# Patient Record
Sex: Male | Born: 1970
Health system: Southern US, Community
[De-identification: ages and names within clinical notes are randomized; demographics above are authoritative.]

## PROBLEM LIST (undated history)

## (undated) DIAGNOSIS — E785 Hyperlipidemia, unspecified: Secondary | ICD-10-CM

## (undated) HISTORY — DX: Hyperlipidemia, unspecified: E78.5

---

## 2007-02-06 ENCOUNTER — Ambulatory Visit: Payer: Self-pay | Admitting: Internal Medicine

## 2007-02-09 ENCOUNTER — Ambulatory Visit: Payer: Self-pay | Admitting: *Deleted

## 2007-02-20 ENCOUNTER — Ambulatory Visit: Payer: Self-pay | Admitting: Family Medicine

## 2007-02-20 ENCOUNTER — Encounter (INDEPENDENT_AMBULATORY_CARE_PROVIDER_SITE_OTHER): Payer: Self-pay | Admitting: Internal Medicine

## 2007-02-20 LAB — CONVERTED CEMR LAB
HDL: 38 mg/dL — ABNORMAL LOW (ref >39)
Total CHOL/HDL Ratio: 5.7
Triglycerides: 407 mg/dL — ABNORMAL HIGH (ref <150)

## 2007-04-04 ENCOUNTER — Ambulatory Visit: Payer: Self-pay | Admitting: Family Medicine

## 2007-04-26 ENCOUNTER — Ambulatory Visit (HOSPITAL_COMMUNITY): Admission: RE | Admit: 2007-04-26 | Discharge: 2007-04-26 | Payer: Self-pay | Admitting: Internal Medicine

## 2007-04-26 ENCOUNTER — Ambulatory Visit: Payer: Self-pay | Admitting: Internal Medicine

## 2007-04-26 LAB — CONVERTED CEMR LAB
ALT: 18 units/L (ref 0–53)
AST: 21 units/L (ref 0–37)
Albumin: 4.9 g/dL (ref 3.5–5.2)
Alkaline Phosphatase: 75 units/L (ref 39–117)
Basophils Absolute: 0 10*3/uL (ref 0.0–0.1)
Eosinophils Absolute: 0.1 10*3/uL (ref 0.0–0.7)
Eosinophils Relative: 2 % (ref 0–5)
Glucose, Bld: 115 mg/dL — ABNORMAL HIGH (ref 70–99)
Lymphs Abs: 2.8 10*3/uL (ref 0.7–4.0)
MCV: 88.1 fL (ref 78.0–100.0)
Neutrophils Relative %: 51 % (ref 43–77)
Platelets: 214 10*3/uL (ref 150–400)
Potassium: 4.2 meq/L (ref 3.5–5.3)
RDW: 13.3 % (ref 11.5–15.5)
Sodium: 142 meq/L (ref 135–145)
TSH: 4.779 microintl units/mL (ref 0.350–5.50)
Total Bilirubin: 0.9 mg/dL (ref 0.3–1.2)
Total Protein: 7.1 g/dL (ref 6.0–8.3)
Triglycerides: 424 mg/dL — ABNORMAL HIGH (ref <150)
WBC: 7.1 10*3/uL (ref 4.0–10.5)

## 2007-05-02 ENCOUNTER — Ambulatory Visit: Payer: Self-pay | Admitting: Internal Medicine

## 2007-08-08 ENCOUNTER — Encounter: Payer: Self-pay | Admitting: Family Medicine

## 2007-08-08 ENCOUNTER — Ambulatory Visit: Payer: Self-pay | Admitting: Internal Medicine

## 2007-08-08 LAB — CONVERTED CEMR LAB
ALT: 26 units/L (ref 0–53)
AST: 27 units/L (ref 0–37)
Albumin: 4.7 g/dL (ref 3.5–5.2)
Alkaline Phosphatase: 70 units/L (ref 39–117)
BUN: 11 mg/dL (ref 6–23)
Calcium: 9.8 mg/dL (ref 8.4–10.5)
Chloride: 103 meq/L (ref 96–112)
HDL: 43 mg/dL (ref >39)
Helicobacter Pylori Antibody-IgG: 1.8 — ABNORMAL HIGH
LDL Cholesterol: 88 mg/dL (ref 0–99)
Potassium: 4.6 meq/L (ref 3.5–5.3)
Sodium: 140 meq/L (ref 135–145)
TSH: 2.186 microintl units/mL (ref 0.350–4.50)

## 2007-12-24 ENCOUNTER — Ambulatory Visit: Payer: Self-pay | Admitting: Internal Medicine

## 2008-02-12 ENCOUNTER — Ambulatory Visit: Payer: Self-pay | Admitting: Internal Medicine

## 2008-02-12 ENCOUNTER — Encounter: Payer: Self-pay | Admitting: Family Medicine

## 2008-02-12 LAB — CONVERTED CEMR LAB
ALT: 12 units/L (ref 0–53)
AST: 18 units/L (ref 0–37)
Albumin: 4.9 g/dL (ref 3.5–5.2)
Alkaline Phosphatase: 75 units/L (ref 39–117)
BUN: 24 mg/dL — ABNORMAL HIGH (ref 6–23)
Chloride: 104 meq/L (ref 96–112)
Creatinine, Ser: 1.14 mg/dL (ref 0.40–1.50)
HDL: 48 mg/dL (ref >39)
PSA: 0.53 ng/mL (ref 0.10–4.00)
Potassium: 4.3 meq/L (ref 3.5–5.3)
Testosterone: 290.1 ng/dL — ABNORMAL LOW (ref 350–890)
Total CHOL/HDL Ratio: 4.9

## 2008-02-20 ENCOUNTER — Ambulatory Visit: Payer: Self-pay | Admitting: Internal Medicine

## 2008-05-07 ENCOUNTER — Ambulatory Visit: Payer: Self-pay | Admitting: Family Medicine

## 2008-05-07 LAB — CONVERTED CEMR LAB
ALT: 14 units/L (ref 0–53)
AST: 23 units/L (ref 0–37)
Alkaline Phosphatase: 81 units/L (ref 39–117)
BUN: 16 mg/dL (ref 6–23)
Creatinine, Ser: 1.1 mg/dL (ref 0.40–1.50)
HDL: 49 mg/dL (ref >39)
LDL Cholesterol: 105 mg/dL — ABNORMAL HIGH (ref 0–99)
Total CHOL/HDL Ratio: 3.8
VLDL: 30 mg/dL (ref 0–40)

## 2008-05-21 ENCOUNTER — Ambulatory Visit: Payer: Self-pay | Admitting: Family Medicine

## 2008-08-20 ENCOUNTER — Encounter (INDEPENDENT_AMBULATORY_CARE_PROVIDER_SITE_OTHER): Payer: Self-pay | Admitting: Gastroenterology

## 2008-08-20 ENCOUNTER — Ambulatory Visit (HOSPITAL_COMMUNITY): Admission: RE | Admit: 2008-08-20 | Discharge: 2008-08-20 | Payer: Self-pay | Admitting: Gastroenterology

## 2009-01-07 ENCOUNTER — Ambulatory Visit: Payer: Self-pay | Admitting: Internal Medicine

## 2009-06-12 ENCOUNTER — Ambulatory Visit: Payer: Self-pay | Admitting: Internal Medicine

## 2009-07-01 ENCOUNTER — Ambulatory Visit: Payer: Self-pay | Admitting: Internal Medicine

## 2009-07-01 LAB — CONVERTED CEMR LAB
ALT: 20 units/L (ref 0–53)
AST: 25 units/L (ref 0–37)
Albumin: 4.8 g/dL (ref 3.5–5.2)
Alkaline Phosphatase: 62 units/L (ref 39–117)
BUN: 14 mg/dL (ref 6–23)
Basophils Absolute: 0 10*3/uL (ref 0.0–0.1)
Eosinophils Relative: 3 % (ref 0–5)
HDL: 43 mg/dL (ref >39)
LDL Cholesterol: 113 mg/dL — ABNORMAL HIGH (ref 0–99)
Lymphocytes Relative: 48 % — ABNORMAL HIGH (ref 12–46)
Neutro Abs: 2.8 10*3/uL (ref 1.7–7.7)
Neutrophils Relative %: 43 % (ref 43–77)
Platelets: 189 10*3/uL (ref 150–400)
Potassium: 4.2 meq/L (ref 3.5–5.3)
RDW: 13.6 % (ref 11.5–15.5)
Sodium: 139 meq/L (ref 135–145)
Total CHOL/HDL Ratio: 5
Vit D, 25-Hydroxy: 34 ng/mL (ref 30–89)

## 2009-07-29 ENCOUNTER — Emergency Department (HOSPITAL_COMMUNITY): Admission: EM | Admit: 2009-07-29 | Discharge: 2009-07-29 | Payer: Self-pay | Admitting: Family Medicine

## 2010-06-08 NOTE — Op Note (Signed)
NAMEWOODLEY, PETZOLD NO.:  [id number]   MEDICAL RECORD NO.:  [id number]          PATIENT TYPE:  AMB   LOCATION:  ENDO                         FACILITY:  [facility]   PHYSICIAN:  Ulyess Mort, MD  DATE OF BIRTH:  [birth year]   DATE OF PROCEDURE:  DATE OF DISCHARGE:                               OPERATIVE REPORT   A very nice patient from [facility] referred because of three episodes  of hematemesis, blood tinged vomitus and has a history of alcohol usage  and marijuana usage.  He has hypertriglyceridemia, has a history of GERD  with some reflux symptoms, and occasional epigastric pain, although he  says he has done well as long as he takes his proton pump inhibitors and  has been started on sucralfate.  He did have some guaiac positive stools  and thus it was thought that he should have upper endoscopic  examination.   PHYSICAL EXAMINATION:  GENERAL:  He is a healthy looking 40 year old.  VITAL SIGNS:  Blood pressure 123/84, pulse 61 and regular, respirations  13 and regular.  HEENT:  His oropharynx was negative.  NECK:  Negative.  CHEST:  Clear.  HEART:  Revealed a regular rhythm without significant murmur.  ABDOMEN:  Unremarkable.  Soft, no masses or organomegaly.   PROCEDURE:  The patient was established with 75 mcg of fentanyl and 7 mg  of Versed IV.  His oropharynx was anesthetized with Endocaine spray.  The Pentax videoscope was passed to the proximal esophagus without  difficulty.  The proximal esophagus was normal.  The distal esophagus  was normal.  The stomach was well visualized in its entirety and the  endoscope was inverted and everything appeared entirely normal.  No  evidence of any inflammation or any bleeding source, except for some  very small nodular areas in the prepyloric area which are probably of no  consequence, but I biopsied just to be on the complete side and safe  side and sent these to pathology for interpretation.  The endoscope  was  brought back into the esophagus and retracted completely and no  pathology of significance seen.  The vocal cords appeared normal.   IMPRESSION:  1. Esophagus:  Essentially normal.  2. Stomach:  Essentially normal, except for some small pinpoint      nodules in the prepyloric region which were biopsied of      questionable significance.  3. Pyloric channel:  Normal.  4. Duodenal bulb:  Normal.  5. C-loop:  Normal.   I certainly did not see any evidence of a bleeding source here.  It may  well have healed in the process since he said it has been six weeks  since he had any symptoms.  He did have guaiac positive stools and I  would certainly recheck his stools and his hemoglobin and hematocrit,  and if there is any evidence of further positive stools, I think that a  full colonoscopic examination would be in order.  Otherwise, I would  treat him symptomatically.      Ulyess Mort, MD     SML/MEDQ  D:  08/20/2008  T:  08/21/2008  Job:  [id number]   cc:   [facility]

## 2012-01-25 DIAGNOSIS — E785 Hyperlipidemia, unspecified: Secondary | ICD-10-CM

## 2012-01-25 HISTORY — DX: Hyperlipidemia, unspecified: E78.5

## 2012-02-23 ENCOUNTER — Encounter: Payer: Self-pay | Admitting: Family Medicine

## 2012-02-23 ENCOUNTER — Ambulatory Visit (INDEPENDENT_AMBULATORY_CARE_PROVIDER_SITE_OTHER): Payer: Self-pay | Admitting: Family Medicine

## 2012-02-23 VITALS — BP 117/74 | HR 78 | Temp 97.9°F | Ht 70.5 in | Wt 193.0 lb

## 2012-02-23 DIAGNOSIS — M542 Cervicalgia: Secondary | ICD-10-CM | POA: Insufficient documentation

## 2012-02-23 DIAGNOSIS — E781 Pure hyperglyceridemia: Secondary | ICD-10-CM | POA: Insufficient documentation

## 2012-02-23 DIAGNOSIS — E785 Hyperlipidemia, unspecified: Secondary | ICD-10-CM

## 2012-02-23 NOTE — Assessment & Plan Note (Signed)
Unknown cause - could be tender lymphadenopathy vs. TMJ vs. Neck sprain/strain.  No trismus or signs of temporal arteritis.  No neck rigidity or signs of infection today.  Could be goiter but no palpable lesions on exam.  Could also be related to reflux. - Will treat conservatively with NSAID as needed  - At next visit after he has orange card, will check CBC, TSH, ESR  - Will refer to dentist - Will consider starting PPI to see if this improves symptoms - Red flags reviewed, follow up with as soon as he receive orange card

## 2012-02-23 NOTE — Progress Notes (Signed)
Interpreter Ashby Dawes for Dr Lajoyce Corners de [place]

## 2012-02-23 NOTE — Progress Notes (Signed)
  Subjective:    Patient ID: Dustin Cox, male    DOB: 19-Dec-1970, 42 y.o.   MRN: [id number]  HPI  Patient presents to clinic to establish care.  Former patient at [facility].  He will bring medical records to next visit.  Anterior neck pain:  Has been going on for 3 years.  Pain below jaw bone.  Neck pain comes and goes.   Associated with voice hoarseness and aggravated when around heaters or air conditioners.  Symptoms not associated with cough, congestion, fever, or viral infections.  He does complain of intermittent heartburn and indigestion after eating.  He has told previous PCP at [facility] about pain and was started on medications, but patient does not remember name of medication and he says they were not helpful so he stopped using them.  Patient denies any difficulty swallowing, but does complain of worsening pain after opening/closing his mouth.  Denies any associated headaches.  Review of Systems  Per HPI    Objective:   Physical Exam  Constitutional: He appears well-nourished.  HENT:  Head: Normocephalic and atraumatic.  Mouth/Throat: Oropharynx is clear and moist.  Neck: Normal range of motion. Neck supple. No tracheal deviation present. No thyromegaly present.       Tenderness on palpation of anterior cervical lymph nodes, but no obvious enlargement; no neck rigidity  Cardiovascular: Normal rate and regular rhythm.   Pulmonary/Chest: Effort normal and breath sounds normal. He has no wheezes. He has no rales.  Abdominal: Soft. He exhibits no distension. There is no tenderness.      Assessment & Plan:

## 2012-03-21 ENCOUNTER — Ambulatory Visit (INDEPENDENT_AMBULATORY_CARE_PROVIDER_SITE_OTHER): Payer: No Typology Code available for payment source | Admitting: Family Medicine

## 2012-03-21 ENCOUNTER — Encounter: Payer: Self-pay | Admitting: Family Medicine

## 2012-03-21 VITALS — BP 118/82 | HR 68 | Temp 97.9°F | Ht 70.5 in | Wt 195.0 lb

## 2012-03-21 DIAGNOSIS — E785 Hyperlipidemia, unspecified: Secondary | ICD-10-CM

## 2012-03-21 DIAGNOSIS — R300 Dysuria: Secondary | ICD-10-CM | POA: Insufficient documentation

## 2012-03-21 DIAGNOSIS — Z0000 Encounter for general adult medical examination without abnormal findings: Secondary | ICD-10-CM

## 2012-03-21 LAB — COMPREHENSIVE METABOLIC PANEL
BUN: 14 mg/dL (ref 6–23)
CO2: 26 mEq/L (ref 19–32)
Calcium: 9.8 mg/dL (ref 8.4–10.5)
Chloride: 102 mEq/L (ref 96–112)
Creat: 1.05 mg/dL (ref 0.50–1.35)

## 2012-03-21 LAB — CBC
HCT: 48.6 % (ref 39.0–52.0)
MCV: 83.6 fL (ref 78.0–100.0)
RBC: 5.81 MIL/uL (ref 4.22–5.81)
WBC: 7.6 10*3/uL (ref 4.0–10.5)

## 2012-03-21 LAB — POCT URINALYSIS DIPSTICK
Bilirubin, UA: NEGATIVE
Leukocytes, UA: NEGATIVE
Nitrite, UA: NEGATIVE
Protein, UA: NEGATIVE
pH, UA: 6

## 2012-03-21 NOTE — Assessment & Plan Note (Signed)
Dysuria x 6 months.  Will check UA today.  Will hold off on PSA level at this time, but if UA negative and symptoms do not resolve, patient to return to clinic for possible PSA level and prostate exam.

## 2012-03-21 NOTE — Progress Notes (Signed)
  Subjective:    Patient ID: Dustin Cox, male    DOB: May 19, 1970, 42 y.o.   MRN: [id number]  HPI  Patient presents to clinic for annual physical exam.  He would like to get lipid panel checked.   Patient complains of burning sensation and urgency for the last 6 months.  Associated with mild pain with urination and urinary frequency during day.  He is worried it could be related to his prostate.  Patient denies any penile discharge.  Currently sexually active with one partner in the last 2 months.  Denies any abdominal pain.  He does have intermittent constipation.  Patient denies any associated fever, chills, nausea/vomiting.    I have reviewed and updated patient's PMH, SH, Social History, Problem List, and Allergies.  He has cut back on smoking 1-2 cigarettes per day.  Review of Systems Per HPI     Objective:   Physical Exam  Constitutional: He appears well-nourished. No distress.  HENT:  Head: Normocephalic.  Eyes:  Conjunctiva mild redness bilaterally  Neck: Normal range of motion. Neck supple.  Cardiovascular: Normal rate, regular rhythm and normal heart sounds.   Pulmonary/Chest: Effort normal and breath sounds normal. He has no wheezes. He has no rales.  Abdominal: Soft. Bowel sounds are normal. He exhibits no distension. There is no tenderness.  Neurological: No cranial nerve deficit.      Assessment & Plan:

## 2012-03-21 NOTE — Assessment & Plan Note (Signed)
Will check lipid panel, CBC, and CMET today and notify of results.

## 2012-03-22 ENCOUNTER — Telehealth: Payer: Self-pay | Admitting: Family Medicine

## 2012-03-22 NOTE — Telephone Encounter (Signed)
Please schedule appointment with me to discuss lab results anytime in March.  He will need an interpreter.  Thank you so much.

## 2012-03-30 ENCOUNTER — Ambulatory Visit: Payer: No Typology Code available for payment source | Admitting: Family Medicine

## 2012-04-04 ENCOUNTER — Encounter: Payer: Self-pay | Admitting: Family Medicine

## 2012-04-04 ENCOUNTER — Ambulatory Visit (INDEPENDENT_AMBULATORY_CARE_PROVIDER_SITE_OTHER): Payer: No Typology Code available for payment source | Admitting: Family Medicine

## 2012-04-04 VITALS — BP 112/85 | HR 73 | Temp 99.0°F | Ht 70.5 in | Wt 194.0 lb

## 2012-04-04 DIAGNOSIS — N50819 Testicular pain, unspecified: Secondary | ICD-10-CM

## 2012-04-04 DIAGNOSIS — N509 Disorder of male genital organs, unspecified: Secondary | ICD-10-CM

## 2012-04-04 DIAGNOSIS — E785 Hyperlipidemia, unspecified: Secondary | ICD-10-CM

## 2012-04-04 MED ORDER — ROSUVASTATIN CALCIUM 40 MG PO TABS: 40.0000 mg | ORAL_TABLET | Freq: Every day | ORAL | Status: DC

## 2012-04-04 MED ORDER — DOXYCYCLINE HYCLATE 100 MG PO TABS: 100.0000 mg | ORAL_TABLET | Freq: Two times a day (BID) | ORAL | Status: DC

## 2012-04-04 NOTE — Assessment & Plan Note (Addendum)
Will start Crestor 40 mg daily.  Recheck LFT and lipid panel in 6 months.  Discussed low fat and low carb diet with patient.

## 2012-04-04 NOTE — Progress Notes (Signed)
  Subjective:    Patient ID: Dustin Cox, male    DOB: 1970-11-24, 42 y.o.   MRN: [id number]  HPI  Returns to clinic to discuss lab results.  Glucose mildly elevated.  Lipid panel abnormal. He used to take a statin in the past with previous doctor. Denies any myalgias or side effects, but he says statin did not improve high cholesterol. Diet consists of sodas, tortillas, bread and other foods high in carbohydrates and starches.  Testicular pain: Started 4-5 months ago.   At rest, he feels a mild discomfort, worse with palpation. Testicular pain is constant, described "cold sensation." Denies any dysuria or burning with urination, but complains of urge incontinence. No penile discharge/bleeding or pain with intercourse.  Time spent face to face counseling during encounter: 15 minutes.  Review of Systems Per HPI    Objective:   Physical Exam  Constitutional: He appears well-nourished. No distress.  Genitourinary: Rectum normal and penis normal. Rectal exam shows anal tone normal. Prostate is tender. Right testis shows tenderness. Right testis shows no mass and no swelling. Left testis shows tenderness. Left testis shows no mass and no swelling.      Assessment & Plan:

## 2012-04-04 NOTE — Assessment & Plan Note (Addendum)
Bilateral testicular pain may be secondary to epididymitis.  No fevers or signs of systemic infection.  Prostate normal size, mild tenderness on exam - less likely acute prostatitis.  UA from 2 weeks ago was unremarkable.  Will treat with Doxycycline 100 bid x 10 days for possible epididymitis.  If no improvement in 2 weeks, will consider referral to Urology.

## 2012-04-05 ENCOUNTER — Other Ambulatory Visit: Payer: Self-pay | Admitting: Family Medicine

## 2012-04-05 ENCOUNTER — Telehealth: Payer: Self-pay | Admitting: *Deleted

## 2012-04-05 MED ORDER — DOXYCYCLINE HYCLATE 100 MG PO TABS: 100.0000 mg | ORAL_TABLET | Freq: Two times a day (BID) | ORAL | Status: DC

## 2012-04-05 NOTE — Telephone Encounter (Signed)
Patient comes to office  today stating he cannot afford doxycycline or crestor.  Attempted contacting Dr. Tye Savoy but she is at CME. Consulted with Dr. Leveda Anna and he printed out new RX for doxycycline for patient to take to shop around for best price. Explained to patient that this is the best  antibiotic for him.  Patient is agreeable to this plan.   Will send message to Dr. Tye Savoy about Crestor . Cost for patient at [facility] was $ 215.00 Call patient back at [phone]

## 2012-04-09 MED ORDER — SIMVASTATIN 40 MG PO TABS: 40.0000 mg | ORAL_TABLET | Freq: Every day | ORAL | Status: DC

## 2012-04-09 NOTE — Telephone Encounter (Signed)
I sent a less expensive statin to his pharmacy - Zocor.  Please ask a Spanish interpreter to call him to pick this up at [facility].   Thanks.

## 2012-04-10 NOTE — Telephone Encounter (Signed)
Patient notified

## 2012-04-11 ENCOUNTER — Telehealth: Payer: Self-pay | Admitting: Family Medicine

## 2012-04-11 MED ORDER — SIMVASTATIN 40 MG PO TABS: 40.0000 mg | ORAL_TABLET | Freq: Every day | ORAL | Status: DC

## 2012-04-11 NOTE — Telephone Encounter (Signed)
Printed rx  

## 2012-04-20 ENCOUNTER — Ambulatory Visit (INDEPENDENT_AMBULATORY_CARE_PROVIDER_SITE_OTHER): Payer: No Typology Code available for payment source | Admitting: Family Medicine

## 2012-04-20 ENCOUNTER — Encounter: Payer: Self-pay | Admitting: Family Medicine

## 2012-04-20 VITALS — BP 121/77 | HR 73 | Temp 97.9°F | Ht 70.5 in | Wt 197.3 lb

## 2012-04-20 DIAGNOSIS — H6120 Impacted cerumen, unspecified ear: Secondary | ICD-10-CM | POA: Insufficient documentation

## 2012-04-20 DIAGNOSIS — H6121 Impacted cerumen, right ear: Secondary | ICD-10-CM

## 2012-04-20 NOTE — Progress Notes (Signed)
  Subjective:    Patient ID: Dustin Cox, male    DOB: [birth year], 42 y.o.   MRN: [id number]  HPI  Ear pain and "hissing" sound in ear bilaterally.  Has been going on for 3 months.  Sound is constant and he denies any decreased hearing.  Denies any dizziness, lightheadedness, vertigo.  He has been using Q-tips and rolled up paper to clean out both years.  Denies any associated fevers, HA, runny nose.  Does endorse dry cough.   Review of Systems Per HPI    Objective:   Physical Exam General: in no acute distress HEENT: NCAT. RT ear cerumen impaction, hard and dark; unable to visualize TM.  LT ear normal.     Assessment & Plan:

## 2012-04-20 NOTE — Assessment & Plan Note (Signed)
Cerumen very hard today - advised patient to use DEBROX ear drops to soften ear wax.  When medication runs out, he can return to clinic for irrigation.

## 2012-04-20 NOTE — Patient Instructions (Addendum)
Buy DEBROX ear drops at your pharmacy and follow instructions. When you run out of medication, return to clinic for ear irrigation.

## 2012-09-11 ENCOUNTER — Ambulatory Visit: Payer: Self-pay | Admitting: Family Medicine

## 2012-09-19 ENCOUNTER — Other Ambulatory Visit (HOSPITAL_COMMUNITY)
Admission: RE | Admit: 2012-09-19 | Discharge: 2012-09-19 | Disposition: A | Discharge status: Home / Self Care | Payer: No Typology Code available for payment source | Source: Ambulatory Visit | Attending: Family Medicine | Admitting: Family Medicine

## 2012-09-19 ENCOUNTER — Ambulatory Visit: Payer: No Typology Code available for payment source

## 2012-09-19 ENCOUNTER — Ambulatory Visit (INDEPENDENT_AMBULATORY_CARE_PROVIDER_SITE_OTHER): Payer: No Typology Code available for payment source | Admitting: Family Medicine

## 2012-09-19 ENCOUNTER — Encounter: Payer: Self-pay | Admitting: Family Medicine

## 2012-09-19 VITALS — BP 123/77 | HR 80 | Temp 98.5°F | Wt 196.0 lb

## 2012-09-19 DIAGNOSIS — R519 Headache, unspecified: Secondary | ICD-10-CM | POA: Insufficient documentation

## 2012-09-19 DIAGNOSIS — R51 Headache: Secondary | ICD-10-CM

## 2012-09-19 DIAGNOSIS — M9981 Other biomechanical lesions of cervical region: Secondary | ICD-10-CM

## 2012-09-19 DIAGNOSIS — M9901 Segmental and somatic dysfunction of cervical region: Secondary | ICD-10-CM | POA: Insufficient documentation

## 2012-09-19 DIAGNOSIS — J309 Allergic rhinitis, unspecified: Secondary | ICD-10-CM | POA: Insufficient documentation

## 2012-09-19 DIAGNOSIS — Z113 Encounter for screening for infections with a predominantly sexual mode of transmission: Secondary | ICD-10-CM | POA: Insufficient documentation

## 2012-09-19 DIAGNOSIS — R300 Dysuria: Secondary | ICD-10-CM

## 2012-09-19 LAB — POCT URINALYSIS DIPSTICK
Bilirubin, UA: NEGATIVE
Glucose, UA: NEGATIVE
Leukocytes, UA: NEGATIVE
Nitrite, UA: NEGATIVE
pH, UA: 6

## 2012-09-19 MED ORDER — LORATADINE 10 MG PO TABS: 10.0000 mg | ORAL_TABLET | Freq: Every day | ORAL | Status: DC

## 2012-09-19 NOTE — Patient Instructions (Addendum)
Dear Mr. Dustin Cox, Thank you for coming in to clinic today. It was good to see you!  Today we discussed your Headache, Pain with Urination, and Right Ear problem. 1. Your headache seems most like a tension headache, probably caused by sore muscles in the back of your neck. I would recommend continue using the Ibuprofen if it helps, also you could take a towel or washcloth and run it under warm water and place that on the back of your neck. If the massage helps, you could have your wife rub the back of your neck to help your muscles relax as well. 2. We tested your urine and it was normal. There was no sign of an infection. We will not treat you with an antibiotic today. 3. I believe that you may have allergies, which are contributing to the fluid that I saw behind your ear. I have written a prescription for Loratadine (Claritin) for you to take once a day.  We will call you with the results of one of the urine tests for infection if it is positive and requires treatment, otherwise if you don't hear from [place] then there is no additional medication that you need and it was normal.  We started a new medication today to help your allergies. Loratadine 10 mg, please take 1 tablet once a day as we discussed.  Please call to schedule a follow-up appointment with your regular doctor in 2 weeks to 1 month if your symptoms do not improve, otherwise, please call to schedule an appointment as needed.  If you have any other questions or concerns, please feel free to call the clinic to contact me. You may also schedule an earlier appointment if necessary.  However, if your symptoms get significantly worse, please go to the Emergency Department to seek immediate medical attention.  Saralyn Pilar, DO [facility]

## 2012-09-19 NOTE — Assessment & Plan Note (Signed)
Hx 2 weeks Left-sided HA, constant aching pain starts posterior occipital / neck region and radiates over top of head to L-eye, associated with L-eye watering. No associated aura sx, nausea / vomiting, visual disturbances, numbness / tingling. Improved with Ibuprofen (unknown dose), persistent with recurrence daily Exam - +TTP L-posterior neck occipital base, no trigger point palpated, trapezius w/o spasm or tenderness  Plan: 1. Likely tension headache (2/2 MSK origin) vs possible migraine (less likely d/t no associated sx) 2. Advised continue Ibuprofen as needed for severe pain, but encouraged Tylenol initially. 3. Apply moist heat and massage at home. OMT - performed sub-occipital release, with noted improvement in pain. Pt agreed to treatment, tolerated it well.

## 2012-09-19 NOTE — Progress Notes (Signed)
Subjective:     Patient ID: Dustin Cox, male   DOB: 1970/10/08, 42 y.o.   MRN: [id number]  HPI  HEADACHE   Onset: 2 weeks ago Location: L-poster occipital w/ radiation to L-frontal / L-eye Quality: aching (denies pulsing / throbbing) Frequency: daily Precipitating factors: unknown Prior treatment: Ibuprofen (good relief)  Associated Symptoms Nausea/vomiting: no  Photophobia/phonophobia: no  Tearing of eyes: yes  Sinus pain/pressure: no   Red Flags Fever: no  Neck pain/stiffness: yes - pain posterior occipital location (+TTP) - NO STIFFNESS, Full ROM Vision/speech/swallow/hearing difficulty: no  Focal weakness/numbness: no  Altered mental status: no  Trauma: no  Anticoagulant use: no  H/o cancer/HIV/Pregnancy: no   DYSURIA: Presents with 2-3 day hx of burning with urination. Reports midline lower abd tenderness as well. No radiation or flank pain. Good UOP, normal BM. Denies hematuria, discharge, no testicular pain. Admits unprotected sex (w/o new partner)  RIGHT EAR COMPLAINT: Chronic complaint past 6 months of "whooshing sound" heard in Right ear. Reports previously having been told he has a lot of wax, and took ear drops to remove wax. Does use Q-tips for cleaning ears. Denies tinnitus, dizziness, vertigo, ear pain or discharge.  Review of Systems  See above HPI.     Objective:   Physical Exam  BP 123/77  Pulse 80  Temp(Src) 98.5 F (36.9 C) (Oral)  Wt 196 lb (88.905 kg)  BMI 27.72 kg/m2  General - pleasant, NAD HEENT - +conjunctival injection w/ bilateral medial pterygium, PERRLA, EOMI, R-ear +serous effusion w/ normal TM and mild erythema in canal, possible evidence of previous scarring. L-ear norml w/ gray TM, good light reflex Neck - +TTP Left posterior occipital region. No trigger point noted. Supple, full active ROM. Heart - RRR, no murmurs Lungs - CTAB Abdomen - soft, non-distended, +mild TTP central lower suprapubic region, +BS in all  quads Neuro - grossly non-focal, muscle str intact 5/5 bilaterally, intact distal sensation to light touch

## 2012-09-19 NOTE — Assessment & Plan Note (Signed)
Recent 2-3 day hx of dysuria w/ lower abd pain. ROS otherwise negative for other GI or GU complaints. Denies penile discharge, hematuria. Admits to unprotected sex (1 partner, wife). Exam - GU benign  Plan: 1. UA - negative (microscopy not indicated) 2. GC/Chlam urine probe - pending. Advised pt will call if positive for infection, and requires further treatment.

## 2012-09-19 NOTE — Assessment & Plan Note (Signed)
Constellation of symptoms / findings on exam that suggest likely allergies. Complain of chronic 6 mo R-ear "whooshing noise", w/o pain or discharge, dry / itchy watery eyes. Suspected eustachian tube dysfunction, secondary to allergies Exam - R-ear: serous effusion appearance of TM w/o significant erythema, L-ear: normal. Eyes - bilateral conjunctival injection medially with +pterygium bilaterally  Plan: 1. Start Loratadine 10mg  daily 2. Unlikely infectious etiology L-ear due to chronic nature, afebrile, no dizziness or vertigo (unlikely Meniere's)

## 2012-11-19 ENCOUNTER — Ambulatory Visit (INDEPENDENT_AMBULATORY_CARE_PROVIDER_SITE_OTHER): Payer: No Typology Code available for payment source | Admitting: Family Medicine

## 2012-11-19 VITALS — BP 118/74 | HR 80 | Temp 98.1°F | Ht 70.5 in | Wt 201.4 lb

## 2012-11-19 DIAGNOSIS — M542 Cervicalgia: Secondary | ICD-10-CM

## 2012-11-19 DIAGNOSIS — R6884 Jaw pain: Secondary | ICD-10-CM

## 2012-11-19 DIAGNOSIS — M62838 Other muscle spasm: Secondary | ICD-10-CM | POA: Insufficient documentation

## 2012-11-19 DIAGNOSIS — E785 Hyperlipidemia, unspecified: Secondary | ICD-10-CM

## 2012-11-19 MED ORDER — BACLOFEN 10 MG PO TABS: 10.0000 mg | ORAL_TABLET | Freq: Every evening | ORAL | Status: DC | PRN

## 2012-11-19 MED ORDER — SIMVASTATIN 40 MG PO TABS: 40.0000 mg | ORAL_TABLET | Freq: Every day | ORAL | Status: DC

## 2012-11-19 NOTE — Progress Notes (Signed)
  Subjective:    Patient ID: Dustin Cox, male    DOB: 10-Jan-1971, 42 y.o.   MRN: [id number]  HPI 42 year old M with neck pain and jaw pain.  Neck Pain - located on the right posterior and radiates to the back of the head, duration of several months, exacerbated by turning his head, alleviated by taking ibuprofen but he does not want to take ibuprofen each day; no hx of recent trauma or heavy lifting; pt has worked [employer] for several years but currently unemployed   Jaw Pain - left side of jaw, present for months but waxes and wanes with worsening over the last 3 days, denies radiating pain, no swelling, redness or drainage of the gum, no recent trauma, no weight loss or difficulty chewing   Visit conducted in Spanish with professional interpreter present   Review of Systems See HPI    Objective:   Physical Exam BP 118/74  Pulse 80  Temp(Src) 98.1 F (36.7 C) (Oral)  Ht 5' 10.5" (1.791 m)  Wt 201 lb 6.4 oz (91.354 kg)  BMI 28.48 kg/m2 Gen: well appearing male Jaw: mild tenderness proximal to the angle of the madible on the left side without bony abnormality, no parotid swelling or tenderness, no submandibular lymphadenopathy  Teeth - fracture of second molar on left without gingival swelling Neck: normal appearing, normal ROM, ttp to palpation of right paraspinal muscles,  Neuro: negative spurling's test bilaterally; 5/5 strength of upper extremities bilaterally with 2+ DTR of biceps, brachiolradialis, and triceps, normal grip strength and sensation       Assessment & Plan:

## 2012-11-19 NOTE — Assessment & Plan Note (Signed)
Assessment: mild spasm, possible facet arthritis vs radicular pain Plan: baclofen for symptoms, obtain X-ray 2 view neck to evaluate vertebral column

## 2012-11-19 NOTE — Patient Instructions (Signed)
Fue Curator. Para el dolor de cuello y dolor en la [place], vamos a obtener una radiografa. Por favor, tome el ibuprofeno y los nuevos medicamentos llamados baclofeno para que el dolor de cuello. Por favor, el seguimiento en 4 semanas con el Dr. Gwenlyn Saran.  Dr. Clinton Sawyer

## 2012-11-19 NOTE — Assessment & Plan Note (Addendum)
Assessment: mandibular pain without swelling and no involvement of parotid, may be secondary to tooth fracture Plan: obtain orthopantogram to r/o bone cyst or tumor, otherwise f/u with dentist

## 2013-02-19 ENCOUNTER — Encounter (HOSPITAL_COMMUNITY): Payer: Self-pay | Admitting: Emergency Medicine

## 2013-02-19 ENCOUNTER — Emergency Department (HOSPITAL_COMMUNITY)
Admission: EM | Admit: 2013-02-19 | Discharge: 2013-02-19 | Disposition: A | Discharge status: Home / Self Care | Payer: No Typology Code available for payment source | Source: Home / Self Care | Attending: Family Medicine | Admitting: Family Medicine

## 2013-02-19 DIAGNOSIS — H9313 Tinnitus, bilateral: Secondary | ICD-10-CM

## 2013-02-19 DIAGNOSIS — H9319 Tinnitus, unspecified ear: Secondary | ICD-10-CM

## 2013-02-19 DIAGNOSIS — H6691 Otitis media, unspecified, right ear: Secondary | ICD-10-CM

## 2013-02-19 DIAGNOSIS — H6690 Otitis media, unspecified, unspecified ear: Secondary | ICD-10-CM

## 2013-02-19 MED ORDER — AMOXICILLIN 500 MG PO TABS: 500.0000 mg | ORAL_TABLET | Freq: Three times a day (TID) | ORAL | Status: DC

## 2013-02-19 MED ORDER — PREDNISONE 50 MG PO TABS: ORAL_TABLET | ORAL | Status: DC

## 2013-02-19 NOTE — Discharge Instructions (Signed)
You may have an infection in your right ear. The ringing in your ears might be caused by infection. You probably should be seen by an ear specialist. Dr. Gwenlyn SaranLosq might be able to help you be seen by a specialist, but it might not be in [place]. Make an appointment to see her in about 1 week to see if she can refer you to an ear doctor.  Take amoxicillin 500 mg three times a day for 7 days. Take prednisone 50 mg once a day for 5 days. These medicines might help clear up some of your symptoms.

## 2013-02-19 NOTE — ED Notes (Signed)
Pt reports  r  Earache  And  sensation  Of  Ringing   in his  Ear  Which  He  Reports  Is  Causing a  Headache            He  Reports  The  Symptoms  Have  Been  For  About 4  Months      -  He  Ambulated  To  Room  With  a  Steady  Fluid  Gait

## 2013-02-19 NOTE — ED Provider Notes (Signed)
Dustin Cox is a 43 y.o. male who presents to Urgent Care today for right ear pain / ringing and headache. Ringing in his right ear is his major complaint, which keeps him up at night. Pain is sharp in nature and intermittent, but pt states he can't hear as well out of his right ear and occasionally has the sensation of feeling like his ear "closes up." His symptoms in his left ear are not as severe. Symptoms have been present about 4 months; pt has been seen by his primary care office for similar complaints but states "they haven't been able to help at all." For the past month, his ear symptoms have been worse and he has had some sore throat and tenderness in his neck. He otherwise denies fever / chills, N/V, abdominal pain.  Past Medical History  Diagnosis Date  . Hyperlipidemia    History  Substance Use Topics  . Smoking status: Light Tobacco Smoker -- 0.25 packs/day for 30 years    Types: Cigarettes  . Smokeless tobacco: Never Used  . Alcohol Use: Yes     Comment: drinks about 7-9 beers every other weekend   ROS as above Medications: No current facility-administered medications for this encounter.   Current Outpatient Prescriptions  Medication Sig Dispense Refill  . baclofen (LIORESAL) 10 MG tablet Take 1 tablet (10 mg total) by mouth at bedtime as needed.  30 each  0  . doxycycline (VIBRA-TABS) 100 MG tablet Take 1 tablet (100 mg total) by mouth 2 (two) times daily.  20 tablet  0  . loratadine (CLARITIN) 10 MG tablet Take 1 tablet (10 mg total) by mouth daily.  30 tablet  11  . simvastatin (ZOCOR) 40 MG tablet Take 1 tablet (40 mg total) by mouth at bedtime.  30 tablet  11    Exam:  BP 118/87  Pulse 66  Temp(Src) 98 F (36.7 C) (Oral)  Resp 16  Wt 198 lb (89.812 kg)  SpO2 98% Gen: Well-appearing adult male in NAD HEENT: EOMI,  MMM, nasal mucosae and posterior oropharynx slightly erythematous but without exudate  TM's: left gray, clear, without bulging; right  slightly red with dullness at base Lungs: Normal work of breathing. CTAB Heart: RRR no MRG Exts: Brisk capillary refill, warm and well perfused.   No results found for this or any previous visit (from the past 24 hour(s)). No results found.  Assessment and Plan: 43 y.o. male with otitis and tinnitus, likely chronic in nature. Possible superinfection given red TM and worse symptoms for 3-4 weeks. Pt likely would benefit from ENT evaluation but does not have insurance. Rx for amoxicillin and prednisone to help with acute symptoms. Instructed pt to follow up with PCP, Dr. Gwenlyn SaranLosq. Will route this note to Dr. Gwenlyn SaranLosq; could consider ENT referral to tertiary center like [facility] or [facility] (more likely to be able to work with pt's finances).  Discussed warning signs or symptoms. Please see discharge instructions. Patient expresses understanding.  The above was discussed in its entirety with attending Urgent Care physician Dr. Denyse Amassorey.   Bobbye Mortonhristopher M Badr Piedra, MD  PGY-2, [facility] 02/19/2013, 12:06 PM  Bobbye Mortonhristopher M Maurine Mowbray, MD 02/19/13 [phone]

## 2013-02-20 NOTE — ED Provider Notes (Signed)
Medical screening examination/treatment/procedure(s) were performed by a resident physician or non-physician practitioner and as the supervising physician I was immediately available for consultation/collaboration.  Brandilee Pies, MD    Alysiana Ethridge S Kairie Vangieson, MD 02/20/13 0748 

## 2013-03-11 ENCOUNTER — Ambulatory Visit (INDEPENDENT_AMBULATORY_CARE_PROVIDER_SITE_OTHER): Payer: No Typology Code available for payment source | Admitting: Family Medicine

## 2013-03-11 ENCOUNTER — Encounter: Payer: Self-pay | Admitting: Family Medicine

## 2013-03-11 VITALS — BP 126/76 | HR 67 | Temp 97.8°F | Ht 70.5 in | Wt 195.3 lb

## 2013-03-11 DIAGNOSIS — H9311 Tinnitus, right ear: Secondary | ICD-10-CM

## 2013-03-11 DIAGNOSIS — H9319 Tinnitus, unspecified ear: Secondary | ICD-10-CM

## 2013-03-11 NOTE — Patient Instructions (Signed)
Your hearing is normal in both ears. Since this has gone for so long, I would like for you to be seen by the [facility] and [facility] specialists at [facility].

## 2013-03-11 NOTE — Progress Notes (Signed)
Patient ID: Dustin Cox    DOB: 06/05/1970, 43 y.o.   MRN: [id number] --- Subjective:  Dustin Cox is a 43 y.o.male who presents with right sided ear ringing. He qualifies it as a "swooshing" sound that is constant, day and night. It has been ongoing for over 6 months. He denies any associated hearing loss. He has occasional dizziness but not associated with the sound in his ear. He denies any change in medication when this first started. Denies any trauma to the ear. Has some occasional nasal congestion and rhinorrhea for which he has taken antihistamines which did not help with the sound in his ear. Nothing makes it better, nothing makes it worst.  He was seen at Urgent Care and was prescribed amoxicillin and prednisone which did not help with the ear sound but did help with headaches. He used to have headaches starting on the left side of head and radiating to the rest of the head. This is now only occasional. No change in vision.  Denies any fevers or chills. He has been using debrox ear drops thinking he has some wax buildup.   ROS: see HPI Past Medical History: reviewed and updated medications and allergies. Social History: Tobacco: current smoker: 1/4 pack per day  Objective: Filed Vitals:   03/11/13 0846  BP: 126/76  Pulse: 67  Temp: 97.8 F (36.6 C)    Physical Examination:   General appearance - alert, well appearing, and in no distress Ears - dull TM's bilaterally, no bulging bilaterally, right TM with mild erythema/possible scarring Eyes - PERRLA, EOMI, medial pterygium bilaterally Nose - mildly erythematous and congested nasal turbinates bilaterally Neuro - CN2-12 grossly intact, Weber localizes to left ear, Winne normal, normal tympanometry bilaterally Mouth - mildly erythematous oropharynx with cobblestone patternNeck - supple, no significant adenopathy Chest - clear to auscultation, no wheezes, rales or rhonchi, symmetric air entry Heart - normal rate, regular  rhythm, normal S1, S2, no murmurs

## 2013-03-11 NOTE — Assessment & Plan Note (Addendum)
Chronic for over 6 months, unilateral. Unclear etiology. Not associated with dizziness or subjective hearing loss. Unlikely Meuniere's since no dizziness. No focal deficits on neuro exam. Normal tympanometry testing bilaterally.  - patient has failed conservative management with antihistamines, steroids and antibiotics. Will refer to ENT for further workup. Will hold on any neuroimaging until evaluated by ENT.

## 2013-03-27 ENCOUNTER — Ambulatory Visit: Payer: No Typology Code available for payment source

## 2013-04-24 ENCOUNTER — Encounter: Payer: Self-pay | Admitting: Family Medicine

## 2013-04-24 ENCOUNTER — Ambulatory Visit (INDEPENDENT_AMBULATORY_CARE_PROVIDER_SITE_OTHER): Payer: No Typology Code available for payment source | Admitting: Family Medicine

## 2013-04-24 VITALS — BP 123/78 | HR 74 | Temp 97.5°F | Ht 70.5 in | Wt 197.3 lb

## 2013-04-24 DIAGNOSIS — M542 Cervicalgia: Secondary | ICD-10-CM

## 2013-04-24 DIAGNOSIS — J029 Acute pharyngitis, unspecified: Secondary | ICD-10-CM

## 2013-04-24 DIAGNOSIS — J020 Streptococcal pharyngitis: Secondary | ICD-10-CM

## 2013-04-24 LAB — CBC WITH DIFFERENTIAL/PLATELET
Basophils Absolute: 0 10*3/uL (ref 0.0–0.1)
Basophils Relative: 0 % (ref 0–1)
Eosinophils Absolute: 0.2 10*3/uL (ref 0.0–0.7)
Eosinophils Relative: 2 % (ref 0–5)
HCT: 46 % (ref 39.0–52.0)
Hemoglobin: 16.3 g/dL (ref 13.0–17.0)
Lymphocytes Relative: 30 % (ref 12–46)
Lymphs Abs: 2.5 10*3/uL (ref 0.7–4.0)
MCH: 30.8 pg (ref 26.0–34.0)
MCHC: 35.4 g/dL (ref 30.0–36.0)
MCV: 87 fL (ref 78.0–100.0)
Monocytes Absolute: 0.7 10*3/uL (ref 0.1–1.0)
Monocytes Relative: 8 % (ref 3–12)
Neutro Abs: 5 10*3/uL (ref 1.7–7.7)
Neutrophils Relative %: 60 % (ref 43–77)
Platelets: 195 10*3/uL (ref 150–400)
RBC: 5.29 MIL/uL (ref 4.22–5.81)
RDW: 14.1 % (ref 11.5–15.5)
WBC: 8.3 10*3/uL (ref 4.0–10.5)

## 2013-04-24 LAB — POCT RAPID STREP A (OFFICE): Rapid Strep A Screen: NEGATIVE

## 2013-04-24 LAB — POCT MONO (EPSTEIN BARR VIRUS): MONO, POC: NEGATIVE

## 2013-04-24 MED ORDER — OMEPRAZOLE 20 MG PO CPDR: 20.0000 mg | DELAYED_RELEASE_CAPSULE | Freq: Every day | ORAL | Status: DC

## 2013-04-24 MED ORDER — TRIAMCINOLONE ACETONIDE 55 MCG/ACT NA AERO: 2.0000 | INHALATION_SPRAY | Freq: Every day | NASAL | Status: DC

## 2013-04-24 NOTE — Patient Instructions (Signed)
We are going to try two medicines and see if it helps with the sore throat.

## 2013-04-25 ENCOUNTER — Encounter: Payer: Self-pay | Admitting: Family Medicine

## 2013-04-25 ENCOUNTER — Telehealth: Payer: Self-pay | Admitting: Family Medicine

## 2013-04-25 LAB — STREP A DNA PROBE: GASP: NEGATIVE

## 2013-04-25 LAB — HIV ANTIBODY (ROUTINE TESTING W REFLEX): HIV: NONREACTIVE

## 2013-04-25 NOTE — Telephone Encounter (Signed)
Called patient and left message stating that labwork was normal.  Will send letter as well.   Marena ChancyStephanie Della Scrivener, PGY-3 Family Medicine Resident

## 2013-04-25 NOTE — Progress Notes (Signed)
Patient ID: Dustin Cox    DOB: 05/16/1970, 43 y.o.   MRN: [id number] --- Subjective:  Dustin Cox is a 43 y.o.male who presents with sore throat for 1 month.  - sore throat: occurs every day, talking makes it worst, drinking water makes it better, it is worst at night, he occasionally has loss of his voice from it. Associated with cough that is worst at night. Has some associated nasal congestion and constant ringing of the right ear which has been ongoing for several months. He states he has had episodes of sore throat like this in the past but now it has recurred.  Denies any fevers or chills. Feeling more tired than usual.   ROS: see HPI Past Medical History: reviewed and updated medications and allergies. Social History: Tobacco:1/4 pack per day  Objective: Filed Vitals:   04/24/13 1620  BP: 123/78  Pulse: 74  Temp: 97.5 F (36.4 C)    Physical Examination:   General appearance - alert, well appearing, and in no distress Ears - right TM erythematous and dull, unchanged from prior exam, left TM normal Nose - erythematous and congested nasal turbinates bilaterally Mouth - erythematous oropharynx with cobblestone pattern, no tonsillar exudates present Neck - supple, tender right sided small cervical lymph node Chest - clear to auscultation, no wheezes, rales or rhonchi, symmetric air entry Heart - normal rate, regular rhythm, normal S1, S2, no murmurs Abdomen - non tender, soft, non distended.

## 2013-04-25 NOTE — Assessment & Plan Note (Signed)
Appears to have been present intermittently for longer than a month. Differential includes mononucelosis vs strep vs HIV related vs reflux vs postnasal drip - mono spot negative - check CBC and HIV - empirically start prilosec for silent reflux since cough and pain are worst with laying down - treat allergies with nasocort. Patient states that he had tried flonase in the past which did not work for this problem. Will try different nasal steroid.

## 2013-05-09 ENCOUNTER — Encounter (HOSPITAL_COMMUNITY): Payer: Self-pay | Admitting: Emergency Medicine

## 2013-05-09 ENCOUNTER — Emergency Department (HOSPITAL_COMMUNITY)
Admission: EM | Admit: 2013-05-09 | Discharge: 2013-05-09 | Disposition: A | Discharge status: Home / Self Care | Payer: No Typology Code available for payment source | Attending: Emergency Medicine | Admitting: Emergency Medicine

## 2013-05-09 ENCOUNTER — Emergency Department (HOSPITAL_COMMUNITY): Payer: No Typology Code available for payment source

## 2013-05-09 DIAGNOSIS — E785 Hyperlipidemia, unspecified: Secondary | ICD-10-CM | POA: Insufficient documentation

## 2013-05-09 DIAGNOSIS — J189 Pneumonia, unspecified organism: Secondary | ICD-10-CM | POA: Insufficient documentation

## 2013-05-09 DIAGNOSIS — F17200 Nicotine dependence, unspecified, uncomplicated: Secondary | ICD-10-CM | POA: Insufficient documentation

## 2013-05-09 MED ORDER — HYDROCODONE-HOMATROPINE 5-1.5 MG/5ML PO SYRP
5.0000 mL | ORAL_SOLUTION | Freq: Four times a day (QID) | ORAL | Status: DC | PRN
Start: 2013-05-09 — End: 2014-04-02

## 2013-05-09 MED ORDER — ACETAMINOPHEN 325 MG PO TABS
650.0000 mg | ORAL_TABLET | Freq: Once | ORAL | Status: AC
  Administered 2013-05-09: 650 mg via ORAL
  Filled 2013-05-09: qty 2

## 2013-05-09 MED ORDER — LEVOFLOXACIN 500 MG PO TABS: 500.0000 mg | ORAL_TABLET | Freq: Every day | ORAL | Status: DC

## 2013-05-09 NOTE — Discharge Instructions (Signed)
Take Levaquin as directed until gone. Take hycodan as needed for cough. Refer to attached documents for more information. Follow up with your doctor for further evaluation.

## 2013-05-09 NOTE — ED Provider Notes (Signed)
CSN: [id number]     Arrival date & time 05/09/13  1235 History  This chart was scribed for non-physician practitioner, Emilia BeckKaitlyn Anaalicia Reimann, PA-C working with Laray AngerKathleen M McManus, DO by Greggory StallionKayla Andersen, ED scribe. This patient was seen in room TR09C/TR09C and the patient's care was started at 1:49 PM.   Chief Complaint  Patient presents with  . Generalized Body Aches   The history is provided by the patient. No language interpreter was used.   HPI Comments: Dustin Cox is a 43 y.o. male who presents to the Emergency Department complaining of nonproductive cough that started several weeks ago. Pt also has associated generalized body aches and sore throat. He had subjective fever last night but denies one currently. Denies sick contacts. Pt smokes cigarettes sometimes.   Past Medical History  Diagnosis Date  . Hyperlipidemia    History reviewed. No pertinent past surgical history. Family History  Problem Relation Age of Onset  . Hyperlipidemia Mother   . Early death Father    History  Substance Use Topics  . Smoking status: Light Tobacco Smoker -- 0.25 packs/day for 30 years    Types: Cigarettes  . Smokeless tobacco: Never Used  . Alcohol Use: Yes     Comment: drinks about 7-9 beers every other weekend    Review of Systems  Constitutional: Negative for fever.  HENT: Positive for sore throat.   Respiratory: Positive for cough.   Musculoskeletal: Positive for myalgias.  All other systems reviewed and are negative.  Allergies  Review of patient's allergies indicates no known allergies.  Home Medications   Prior to Admission medications   Medication Sig Start Date End Date Taking? Authorizing Provider  loratadine (CLARITIN) 10 MG tablet Take 1 tablet (10 mg total) by mouth daily. 09/19/12   Saralyn PilarAlexander Karamalegos, DO  omeprazole (PRILOSEC) 20 MG capsule Take 1 capsule (20 mg total) by mouth daily. 04/24/13   Lonia SkinnerStephanie E Losq, MD  simvastatin (ZOCOR) 40 MG tablet Take 1  tablet (40 mg total) by mouth at bedtime. 11/19/12   Garnetta BuddyEdward V Williamson, MD  triamcinolone (NASACORT AQ) 55 MCG/ACT AERO nasal inhaler Place 2 sprays into the nose daily. 04/24/13   Lonia SkinnerStephanie E Losq, MD   BP 115/72  Pulse 85  Temp(Src) 98.5 F (36.9 C) (Oral)  Resp 16  Wt 197 lb (89.359 kg)  SpO2 95%  Physical Exam  Nursing note and vitals reviewed. Constitutional: He is oriented to person, place, and time. He appears well-developed and well-nourished. No distress.  HENT:  Head: Normocephalic and atraumatic.  Mouth/Throat: Oropharynx is clear and moist.  Eyes: EOM are normal.  Neck: Neck supple. No tracheal deviation present.  Cardiovascular: Normal rate, regular rhythm and normal heart sounds.   Pulmonary/Chest: Effort normal and breath sounds normal. No respiratory distress. He has no wheezes. He has no rales.  Musculoskeletal: Normal range of motion.  Neurological: He is alert and oriented to person, place, and time.  Skin: Skin is warm and dry.  Psychiatric: He has a normal mood and affect. His behavior is normal.    ED Course  Procedures (including critical care time)  DIAGNOSTIC STUDIES: Oxygen Saturation is 95% on RA, adequate by my interpretation.    COORDINATION OF CARE: 1:50 PM-Discussed treatment plan which includes an antibiotic and cough medication with pt at bedside and pt agreed to plan.   Labs Review Labs Reviewed - No data to display  Imaging Review Dg Chest 2 View  05/09/2013   CLINICAL DATA:  Cough, congestion  EXAM: CHEST  2 VIEW  COMPARISON:  None.  FINDINGS: The heart size and mediastinal contours are within normal limits. There is question minimal patchy opacity of the medial right lung base. There is no pleural effusion. There is no pulmonary edema The visualized skeletal structures are unremarkable.  IMPRESSION: Question mild asymmetric patchy opacity in the medial right lung base, developing pneumonia is not excluded.   Electronically Signed   By:  Sherian ReinWei-Chen  Lin M.D.   On: 05/09/2013 13:16     EKG Interpretation None      MDM   Final diagnoses:  CAP (community acquired pneumonia)    1:55 PM Patient's symptoms and chest xray likely are CAP. Patient will be treated with levaquin and hycodan. Vitals stable and patient afebrile. Patient advised to follow up with PCP. Patient instructed to return with worsening or concerning symptoms.   I personally performed the services described in this documentation, which was scribed in my presence. The recorded information has been reviewed and is accurate.  Emilia BeckKaitlyn Kaelynne Christley, PA-C 05/09/13 1356

## 2013-05-09 NOTE — ED Notes (Signed)
He states hes had a dry cough, body aches, and sore throat for months. He denies any fevers or chills. He lives at home with his family and states no one else has been sick in the house. He is A&Ox4, resp e/u

## 2013-05-10 NOTE — ED Provider Notes (Signed)
Medical screening examination/treatment/procedure(s) were performed by non-physician practitioner and as supervising physician I was immediately available for consultation/collaboration.   EKG Interpretation None        Gillermo Poch M Silveria Botz, DO 05/10/13 0727 

## 2013-09-06 ENCOUNTER — Encounter: Payer: Self-pay | Admitting: Family Medicine

## 2013-09-06 ENCOUNTER — Ambulatory Visit (INDEPENDENT_AMBULATORY_CARE_PROVIDER_SITE_OTHER): Payer: No Typology Code available for payment source | Admitting: Family Medicine

## 2013-09-06 VITALS — BP 105/62 | HR 75 | Temp 98.0°F | Wt 192.1 lb

## 2013-09-06 DIAGNOSIS — S29012A Strain of muscle and tendon of back wall of thorax, initial encounter: Secondary | ICD-10-CM

## 2013-09-06 DIAGNOSIS — S239XXA Sprain of unspecified parts of thorax, initial encounter: Secondary | ICD-10-CM

## 2013-09-06 MED ORDER — METAXALONE 800 MG PO TABS: 800.0000 mg | ORAL_TABLET | Freq: Three times a day (TID) | ORAL | Status: DC | PRN

## 2013-09-06 MED ORDER — NAPROXEN 500 MG PO TABS: 500.0000 mg | ORAL_TABLET | Freq: Two times a day (BID) | ORAL | Status: DC

## 2013-09-06 NOTE — Progress Notes (Signed)
Dustin Cox is a 43 y.o. male who presents today for back pain.  Pt states this "back pain" located around the rhomboids in the thoracic region started about 1.5-2 wks ago.  He is concerned this is PNA because he was dx with this in April with similar back pain.  However, he denies any fever, chills, sweats, productive cough, shortness of breath.  This pain is worse when laying on the area or moving around and states it is "constant".  Does fluctuate in intensity but Pt denies any current bowel/bladder problems, fever, chills, unintentional weight loss, night time awakenings secondary to pain, weakness in one or both legs.  He is very adamant that he does not have MSK or spasm today for some reason and states " I think this is PNA".  Denies injury to the area, denies injury to this area in the past.   Past Medical History  Diagnosis Date  . Hyperlipidemia     History  Smoking status  . Light Tobacco Smoker -- 0.25 packs/day for 30 years  . Types: Cigarettes  Smokeless tobacco  . Never Used    Family History  Problem Relation Age of Onset  . Hyperlipidemia Mother   . Early death Father     Current Outpatient Prescriptions on File Prior to Visit  Medication Sig Dispense Refill  . HYDROcodone-homatropine (HYCODAN) 5-1.5 MG/5ML syrup Take 5 mLs by mouth every 6 (six) hours as needed.  120 mL  0  . levofloxacin (LEVAQUIN) 500 MG tablet Take 1 tablet (500 mg total) by mouth daily.  7 tablet  0  . loratadine (CLARITIN) 10 MG tablet Take 1 tablet (10 mg total) by mouth daily.  30 tablet  11  . omeprazole (PRILOSEC) 20 MG capsule Take 1 capsule (20 mg total) by mouth daily.  30 capsule  3  . simvastatin (ZOCOR) 40 MG tablet Take 1 tablet (40 mg total) by mouth at bedtime.  30 tablet  11  . triamcinolone (NASACORT AQ) 55 MCG/ACT AERO nasal inhaler Place 2 sprays into the nose daily.  1 Inhaler  12   No current facility-administered medications on file prior to visit.     ROS: Per HPI.  All other systems reviewed and are negative.   Physical Exam Filed Vitals:   09/06/13 1553  BP: 105/62  Pulse: 75  Temp: 98 F (36.7 C)    Physical Examination: General appearance - alert, well appearing, and in no distress Chest - clear to auscultation, no wheezes, rales or rhonchi, symmetric air entry Heart - normal rate and regular rhythm, no murmurs noted Musculoskeletal - Thoracic Spine - No rash or erythema, TTP at the spinalis/longissimus B/L into the rhomboids, normal ROM, normal MS, DTR + 2/4 B/L, Neurovascular intact B/L LE     Chemistry      Component Value Date/Time   NA 136 03/21/2012 1503   K 4.0 03/21/2012 1503   CL 102 03/21/2012 1503   CO2 26 03/21/2012 1503   BUN 14 03/21/2012 1503   CREATININE 1.05 03/21/2012 1503   CREATININE 1.15 07/01/2009 2002      Component Value Date/Time   CALCIUM 9.8 03/21/2012 1503   ALKPHOS 59 03/21/2012 1503   AST 23 03/21/2012 1503   ALT 26 03/21/2012 1503   BILITOT 1.5* 03/21/2012 1503      Lab Results  Component Value Date   WBC 8.3 04/24/2013   HGB 16.3 04/24/2013   HCT 46.0 04/24/2013   MCV 87.0 04/24/2013  PLT 195 04/24/2013   Lab Results  Component Value Date   TSH 2.186 08/08/2007   No results found for this basename: HGBA1C

## 2013-09-06 NOTE — Assessment & Plan Note (Signed)
Pt typical presentation for MSK strain, no red flags for PNA, PE, or malignancy with history or physical.  Pain c/w muscular spasm/strain and will tx with Ice, Naproxen 500 mg BID, Skelaxin 800 mg TID PRN, and back exercises.  For some reason, pt is very hostile towards the dx of MSK pain and is insistent this is PNA.  His O2 sat is 98%, afebrile, well appearing, and no productive cough, which makes me a little perplexed about why he is insistent on this dx.  F/U in one week if no better.

## 2014-04-02 ENCOUNTER — Encounter: Payer: Self-pay | Admitting: Family Medicine

## 2014-04-02 ENCOUNTER — Ambulatory Visit: Payer: Self-pay | Attending: Family Medicine | Admitting: Family Medicine

## 2014-04-02 VITALS — BP 101/64 | HR 66 | Temp 98.1°F | Resp 18 | Ht 70.0 in | Wt 188.0 lb

## 2014-04-02 DIAGNOSIS — M549 Dorsalgia, unspecified: Secondary | ICD-10-CM

## 2014-04-02 DIAGNOSIS — R7309 Other abnormal glucose: Secondary | ICD-10-CM | POA: Insufficient documentation

## 2014-04-02 DIAGNOSIS — R0981 Nasal congestion: Secondary | ICD-10-CM | POA: Insufficient documentation

## 2014-04-02 DIAGNOSIS — M546 Pain in thoracic spine: Secondary | ICD-10-CM | POA: Insufficient documentation

## 2014-04-02 DIAGNOSIS — Z720 Tobacco use: Secondary | ICD-10-CM

## 2014-04-02 DIAGNOSIS — S29012S Strain of muscle and tendon of back wall of thorax, sequela: Secondary | ICD-10-CM

## 2014-04-02 DIAGNOSIS — R059 Cough, unspecified: Secondary | ICD-10-CM

## 2014-04-02 DIAGNOSIS — M62830 Muscle spasm of back: Secondary | ICD-10-CM | POA: Insufficient documentation

## 2014-04-02 DIAGNOSIS — G8929 Other chronic pain: Secondary | ICD-10-CM | POA: Insufficient documentation

## 2014-04-02 DIAGNOSIS — R739 Hyperglycemia, unspecified: Secondary | ICD-10-CM

## 2014-04-02 DIAGNOSIS — R05 Cough: Secondary | ICD-10-CM | POA: Insufficient documentation

## 2014-04-02 DIAGNOSIS — E785 Hyperlipidemia, unspecified: Secondary | ICD-10-CM | POA: Insufficient documentation

## 2014-04-02 DIAGNOSIS — F17200 Nicotine dependence, unspecified, uncomplicated: Secondary | ICD-10-CM | POA: Insufficient documentation

## 2014-04-02 DIAGNOSIS — J029 Acute pharyngitis, unspecified: Secondary | ICD-10-CM | POA: Insufficient documentation

## 2014-04-02 DIAGNOSIS — K219 Gastro-esophageal reflux disease without esophagitis: Secondary | ICD-10-CM | POA: Insufficient documentation

## 2014-04-02 DIAGNOSIS — F17210 Nicotine dependence, cigarettes, uncomplicated: Secondary | ICD-10-CM

## 2014-04-02 LAB — CBC
HEMATOCRIT: 48.8 % (ref 39.0–52.0)
Hemoglobin: 16.6 g/dL (ref 13.0–17.0)
MCH: 29.9 pg (ref 26.0–34.0)
MCHC: 34 g/dL (ref 30.0–36.0)
MCV: 87.9 fL (ref 78.0–100.0)
MPV: 11 fL (ref 8.6–12.4)
Platelets: 182 10*3/uL (ref 150–400)
RBC: 5.55 MIL/uL (ref 4.22–5.81)
RDW: 13.7 % (ref 11.5–15.5)
WBC: 6.2 10*3/uL (ref 4.0–10.5)

## 2014-04-02 LAB — COMPLETE METABOLIC PANEL WITH GFR
ALT: 17 U/L (ref 0–53)
AST: 17 U/L (ref 0–37)
Albumin: 4.3 g/dL (ref 3.5–5.2)
Alkaline Phosphatase: 60 U/L (ref 39–117)
BILIRUBIN TOTAL: 0.8 mg/dL (ref 0.2–1.2)
BUN: 16 mg/dL (ref 6–23)
CHLORIDE: 105 meq/L (ref 96–112)
CO2: 22 mEq/L (ref 19–32)
CREATININE: 0.92 mg/dL (ref 0.50–1.35)
Calcium: 9.1 mg/dL (ref 8.4–10.5)
GFR, Est Non African American: >89 mL/min
Glucose, Bld: 99 mg/dL (ref 70–99)
Potassium: 4.6 mEq/L (ref 3.5–5.3)
Sodium: 139 mEq/L (ref 135–145)
TOTAL PROTEIN: 6.8 g/dL (ref 6.0–8.3)

## 2014-04-02 LAB — GLUCOSE, POCT (MANUAL RESULT ENTRY): POC Glucose: 177 mg/dl — AB (ref 70–99)

## 2014-04-02 LAB — LIPID PANEL
CHOLESTEROL: 224 mg/dL — AB (ref 0–200)
HDL: 44 mg/dL (ref >=40)
LDL Cholesterol: 141 mg/dL — ABNORMAL HIGH (ref 0–99)
TRIGLYCERIDES: 195 mg/dL — AB (ref <150)
Total CHOL/HDL Ratio: 5.1 Ratio
VLDL: 39 mg/dL (ref 0–40)

## 2014-04-02 LAB — POCT GLYCOSYLATED HEMOGLOBIN (HGB A1C): HEMOGLOBIN A1C: 5.8

## 2014-04-02 MED ORDER — CYCLOBENZAPRINE HCL 10 MG PO TABS: 10.0000 mg | ORAL_TABLET | Freq: Every evening | ORAL | Status: DC | PRN

## 2014-04-02 MED ORDER — NAPROXEN 500 MG PO TABS: 500.0000 mg | ORAL_TABLET | Freq: Two times a day (BID) | ORAL | Status: DC | PRN

## 2014-04-02 MED ORDER — AZITHROMYCIN 500 MG PO TABS: 500.0000 mg | ORAL_TABLET | Freq: Every day | ORAL | Status: DC

## 2014-04-02 MED ORDER — PANTOPRAZOLE SODIUM 40 MG PO TBEC: 40.0000 mg | DELAYED_RELEASE_TABLET | Freq: Every day | ORAL | Status: DC

## 2014-04-02 NOTE — Assessment & Plan Note (Signed)
Prediabetes- A1c elevated and CBG elevated but not diabetes range. Recommend low carb diet and regular exercise.

## 2014-04-02 NOTE — Assessment & Plan Note (Signed)
Cough: Please have repeat chest x-ray Take antibiotic, azithromycin 500 mg once daily  Start protonix 40 mg 30 mins before supper for likely GERD

## 2014-04-02 NOTE — Assessment & Plan Note (Signed)
Smoking cessation support: smoking cessation

## 2014-04-02 NOTE — Progress Notes (Signed)
   Subjective:    Patient ID: Dustin Cox, male    DOB: 10/13/1970, 44 y.o.   MRN: [id number] CC: cough and back pain x 1 year  HPI Spanish interpreter present  44 yo M with hx of RLL PNA in 4/15 presents to establish care:  1. Cough and back pain: x 1 year since PNA Dx. Treated with levaquin. Did not have time to rest. Feels like he never got better. Back pain is mid back worse with heavy lifting or working (shovelling). Associated with cough, SOB. Negative for fever, CP. Cough is intermittently productive. Cough is exacerbated by working or drinking cold carbonated beverages. Patient has been dx and treated for MSK back pain since onset, but he does not believe pain is MSK.   2. Smoker: desires to quit. smokign x 30 years. Down to 1/4 PPD. Max was 1 PPD.   3. HLD: no treatment currently. Previously on and tolerated statin therap.y  4. Elevated CBG: hx of prediabetes. No dx of diabetes.   Soc Hx: light smoker Med Hx: HLD and prediabetes Surg Hx: negative  Review of Systems  Constitutional: Negative.  Negative for fever and fatigue.  HENT: Positive for congestion and sore throat. Negative for dental problem, ear discharge, ear pain, sinus pressure and trouble swallowing.   Eyes: Negative.  Negative for pain, discharge, redness and itching.  Respiratory: Positive for cough and shortness of breath. Negative for choking, chest tightness, wheezing and stridor.   Cardiovascular: Negative.   Gastrointestinal: Negative.   Musculoskeletal: Positive for back pain.  Skin: Negative for rash.  Neurological: Negative for light-headedness.  Hematological: Positive for adenopathy.       Objective:   Physical Exam BP 101/64 mmHg  Pulse 66  Temp(Src) 98.1 F (36.7 C) (Oral)  Resp 18  Ht 5\' 10"  (1.778 m)  Wt 188 lb (85.276 kg)  BMI 26.98 kg/m2  SpO2 99% General appearance: alert, cooperative and no distress Head: Normocephalic, without obvious abnormality, atraumatic Eyes:  conjunctivae/corneas clear. PERRL, EOM's intact.  B/l pterygium medial  Ears: normal TM's and external ear canals both ears Nose: no discharge, turbinates pink, swollen Throat: lips, mucosa, and tongue normal; teeth and gums normal Neck: mild anterior cervical adenopathy, no carotid bruit, no JVD, supple, symmetrical, trachea midline and thyroid not enlarged, symmetric, no tenderness/mass/nodules Back: normal back, no rash, tenderness with trigger point b/l rhomboid  Lungs: clear to auscultation bilaterally Chest wall: no tenderness Heart: regular rate and rhythm, S1, S2 normal, no murmur, click, rub or gallop       Assessment & Plan:

## 2014-04-02 NOTE — Assessment & Plan Note (Signed)
High cholesterol- checking cholesterol today

## 2014-04-02 NOTE — Assessment & Plan Note (Signed)
A: GERD suspected P:  PPI

## 2014-04-02 NOTE — Assessment & Plan Note (Addendum)
Back pains: Muscle spasm Heat Muscle relaxer-Flexeril 10 mg at night as needed  NSAID-Naproxen 500 mg twice daily with food as needed.

## 2014-04-02 NOTE — Patient Instructions (Signed)
Mr. Dustin Cox,  Thank you for coming in today. It was a pleasure meeting you. I look forward to being your primary doctor.   1. Cough: Please have repeat chest x-ray Take antibiotic, azithromycin 500 mg once daily  Start protonix 40 mg 30 mins before supper for likely GERD   2. Back pains: Muscle spasm Heat Muscle relaxer-Flexeril 10 mg at night as needed  NSAID-Naproxen 500 mg twice daily with food as needed.   3. High cholesterol- checking cholesterol today  4. Prediabetes- A1c elevated and CBG elevated but not diabetes range. Recommend low carb diet and regular exercise.   5. Smoking cessation support: smoking cessation hotline: 1-800-QUIT-NOW.  Smoking cessation classes are available through [facility]. Call [phone] or visit our website at [email or web address].  F/u in 6 weeks for smoking cessation  Dr. Armen PickupFunches

## 2014-04-02 NOTE — Progress Notes (Signed)
Establish Care Complaining back pain, stated had pneumonia 6 month ago since then has continues upper back pain and cough Hx cholesterol and pre diabetic not taking medication.

## 2014-04-04 ENCOUNTER — Telehealth: Payer: Self-pay

## 2014-04-04 DIAGNOSIS — E7800 Pure hypercholesterolemia, unspecified: Secondary | ICD-10-CM

## 2014-04-04 MED ORDER — ATORVASTATIN CALCIUM 10 MG PO TABS: 10.0000 mg | ORAL_TABLET | Freq: Every day | ORAL | Status: DC

## 2014-04-04 NOTE — Telephone Encounter (Signed)
Interpreter line used Patient is aware of his lab results [email or web address]

## 2014-04-04 NOTE — Telephone Encounter (Signed)
-----   Message from Doris Cheadleeepak Advani, MD sent at 04/03/2014  9:20 AM EST ----- Call and let the patient know that his cholesterol is elevated, advise patient for low fat diet and start taking Lipitor 10 mg daily. We'll check fasting lipid panel in 3 months.

## 2014-06-22 ENCOUNTER — Encounter (HOSPITAL_COMMUNITY): Payer: Self-pay | Admitting: *Deleted

## 2014-06-22 ENCOUNTER — Emergency Department (INDEPENDENT_AMBULATORY_CARE_PROVIDER_SITE_OTHER)
Admission: EM | Admit: 2014-06-22 | Discharge: 2014-06-22 | Disposition: A | Discharge status: Home / Self Care | Payer: Self-pay | Source: Home / Self Care | Attending: Internal Medicine | Admitting: Internal Medicine

## 2014-06-22 ENCOUNTER — Emergency Department (INDEPENDENT_AMBULATORY_CARE_PROVIDER_SITE_OTHER): Payer: Self-pay

## 2014-06-22 DIAGNOSIS — M549 Dorsalgia, unspecified: Secondary | ICD-10-CM

## 2014-06-22 DIAGNOSIS — K2970 Gastritis, unspecified, without bleeding: Secondary | ICD-10-CM

## 2014-06-22 DIAGNOSIS — K219 Gastro-esophageal reflux disease without esophagitis: Secondary | ICD-10-CM

## 2014-06-22 DIAGNOSIS — G8929 Other chronic pain: Secondary | ICD-10-CM

## 2014-06-22 DIAGNOSIS — J208 Acute bronchitis due to other specified organisms: Secondary | ICD-10-CM

## 2014-06-22 MED ORDER — IBUPROFEN 800 MG PO TABS
800.0000 mg | ORAL_TABLET | Freq: Once | ORAL | Status: AC
  Administered 2014-06-22: 800 mg via ORAL

## 2014-06-22 MED ORDER — FAMOTIDINE 40 MG PO TABS: 40.0000 mg | ORAL_TABLET | Freq: Every day | ORAL | Status: DC

## 2014-06-22 MED ORDER — KETOROLAC TROMETHAMINE 60 MG/2ML IM SOLN: 60.0000 mg | Freq: Once | INTRAMUSCULAR | Status: DC

## 2014-06-22 MED ORDER — IPRATROPIUM-ALBUTEROL 0.5-2.5 (3) MG/3ML IN SOLN
3.0000 mL | Freq: Once | RESPIRATORY_TRACT | Status: AC
  Administered 2014-06-22: 3 mL via RESPIRATORY_TRACT

## 2014-06-22 MED ORDER — IPRATROPIUM-ALBUTEROL 0.5-2.5 (3) MG/3ML IN SOLN
RESPIRATORY_TRACT | Status: AC
  Filled 2014-06-22: qty 3

## 2014-06-22 MED ORDER — GI COCKTAIL ~~LOC~~
30.0000 mL | Freq: Once | ORAL | Status: AC
  Administered 2014-06-22: 30 mL via ORAL

## 2014-06-22 MED ORDER — GI COCKTAIL ~~LOC~~
ORAL | Status: AC
  Filled 2014-06-22: qty 30

## 2014-06-22 MED ORDER — ALBUTEROL SULFATE HFA 108 (90 BASE) MCG/ACT IN AERS: 2.0000 | INHALATION_SPRAY | RESPIRATORY_TRACT | Status: DC | PRN

## 2014-06-22 MED ORDER — IBUPROFEN 800 MG PO TABS
ORAL_TABLET | ORAL | Status: AC
  Filled 2014-06-22: qty 1

## 2014-06-22 NOTE — Discharge Instructions (Signed)
Bronquitis aguda (Acute Bronchitis) Se denomina bronquitis cuando las vas respiratorias que van desde la trquea [employer] se irritan, se congestionan y duelen (se inflaman). La bronquitis generalmente produce flema espesa (mucosidad). Esto provoca tos. La tos es el sntoma ms frecuente de la bronquitis. Cuando la bronquitis es [place], generalmente comienza de [place] sbita y desaparece luego de algn tiempo (generalmente en 2 semanas). El hbito de fumar, las alergias y el asma pueden empeorar la bronquitis. Los episodios repetidos de bronquitis pueden causar ms problemas pulmonares. CUIDADOS EN EL HOGAR  Reposo.  Beba abundante cantidad de lquidos para mantener el pis orina) claro o amarillo plido (excepto que debe limitar la ingesta de lquidos por indicacin del mdico).  Tome slo medicamentos de venta libre o recetados, segn las indicaciones del mdico.  Evite fumar o aspirar el humo de otros fumadores. Esto puede empeorar la bronquitis. Si es fumador, considere el uso de chicles o parches en la piel de nicotina. Si deja de fumar, sus pulmones se curarn ms rpido.  Reduzca la probabilidad de enfermarse nuevamente de bronquitis de este modo:  Lvese las manos con frecuencia.  Evite las personas que tengan sntomas de resfro.  Trate de no llevarse las manos a la boca, la nariz o los ojos.  Concurra a las consultas de control con el mdico, segn las indicaciones. SOLICITE AYUDA SI: Los sntomas no mejoran despus de 1 semana de tratamiento. Los sntomas son:  Leonette Most.  Grant Ruts.  Eliminar moco espeso al toser.  Dolores [employer] cuerpo.  Congestin en el pecho.  Escalofros.  Falta de aire.  Dolor de Advertising copywriter. SOLICITE AYUDA DE INMEDIATO SI:   Le sube la fiebre.  Tiene escalofros.  Comienza a Surveyor, minerals aire de manera preocupante.  Tiene expectoracin con sangre (esputo).  Devuelve (vomita) con frecuencia.  Su organismo pierde mucho lquido  (deshidratacin).  Sufre un dolor intenso de [place].  Se desmaya. ASEGRESE DE QUE:   Comprende estas instrucciones.  Controlar su afeccin.  Recibir ayuda de inmediato si no mejora o si empeora. Document Released: 09/12/2012 [facility] Patient Information 2015 [place], [place]. This information is not intended to replace advice given to you by your health care provider. Make sure you discuss any questions you have with your health care provider. Gastritis - Adultos  (Gastritis, Adult)  La gastrittis es la irritacin (inflamacin) de la membrana interna del estmago. Puede ser Neomia Dear enfermedad de inicio sbito (aguda) o de largo plazo (crnica). Si la gastritis no se trata, puede causar sangrado y lceras. CAUSAS  La gastritis se produce cuando la membrana que tapiza interiormente al estmago se debilita o se daa. Los jugos digestivos del estmago inflaman el revestimiento del estmago debilitado. El revestimiento del estmago puede debilitarse o daarse por una infeccin viral o bacteriana. La infeccin bacteriana ms comn es la infeccin por Helicobacter pylori. Tambin puede ser el resultado del consumo excesivo de alcohol, por el uso de ciertos medicamentos o porque hay demasiado cido en el estmago.  SNTOMAS  En algunos casos no hay sntomas. Si se presentan sntomas, stos pueden ser:   Dolor o sensacin de ardor en la parte superior del abdomen.  Nuseas.  Vmitos.  Sensacin molesta de distensin despus de comer. DIAGNSTICO  El mdico puede diagnosticar gastritis segn los sntomas y el examen fsico. Para determinar la causa de la gastritis, el mdico podr:   Pedir anlisis de sangre o de materia fecal para diagnosticar la presencia de la bacteria H pylori.  Gastroscopa. Un tubo delgado y flexible (endoscopio) se pasa por Theatre stage managerel esfago hasta llegar al Teachers Insurance and Annuity Associationestmago. El endoscopio tiene [place] luz y una cmara en el extremo. El mdico utilizar el endoscopio para observar el  interior del [place]mago.  Tomar una muestra de tejido (biopsia) del estmago para examinarlo en el microscopio. TRATAMIENTO  Segn la causa de la gastritis podrn recetarle: Antibiticos, si la causa es una infeccin bacteriana, como una infeccin por H. pylori. Anticidos o bloqueadores H2, si hay demasiado cido en el estmago. El Office Depotmdico le aconsejar que deje de tomar aspirina, ibuprofeno u otros antiinflamatorios no esteroides (AINE).  INSTRUCCIONES PARA EL CUIDADO EN EL HOGAR   Tome slo medicamentos de venta libre o recetados, segn las indicaciones del mdico.  Si le han recetado antibiticos, tmelos segn las indicaciones. Tmelos todos, aunque se sienta mejor.  Debe ingerir gran cantidad de lquido para mantener la orina de tono claro o color amarillo plido.  Evite las comidas y bebidas que [address] Avenueempeoran los Beasleyproblemas, [place]:  [place]esota[place]ebidas con cafena o alcohlicas.  Chocolate.  Sabores a Advertising account plannermenta.  Ajo y cebolla.  Comidas muy condimentadas.  Ctricos como naranjas, limones o limas.  Alimentos que contengan tomate, como salsas, [place] y pizza.  Alimentos fritos y Lexicographergrasos.  Haga comidas pequeas durante Glass blower/designerel da en lugar de 3 comidas abundantes. SOLICITE ATENCIN MDICA DE INMEDIATO SI:   La materia fecal es negra o de color rojo oscuro.  Vomita sangre de color rojo brillante o material similar a granos de caf.  No puede retener los lquidos.  El dolor abdominal empeora.  Tiene fiebre.  No mejora luego de 1 semana.  Tiene preguntas o preocupaciones. ASEGRESE DE QUE:   Comprende estas instrucciones.  Controlar su enfermedad.  Solicitar ayuda de inmediato si no mejora o si empeora. Document Released: 10/20/2004 Document Revised: 10/05/2011 [facility] Patient Information 2015 [place], [place]. This information is not intended to replace advice given to you by your health care provider. Make sure you discuss any questions you have with your health care provider.

## 2014-06-22 NOTE — ED Provider Notes (Signed)
CSN: [id number]     Arrival date & time 06/22/14  1244 History   First MD Initiated Contact with Patient 06/22/14 1356     Chief Complaint  Patient presents with  . Cough   HPI Patient reports little bit of cough, with a one-week history of discomfort in the mid to upper right back, equivocally pleuritic, expresses concern that this is pneumonia. He indicates that he had pneumonia 6 months ago, chart review suggests that this is actually more than a year ago. Chart review also suggests that the patient has ongoing difficulty with similar discomfort in the mid to upper right back, which is chronic. No leg swelling, no new leg discomfort. No fever. Patient also reports epigastric discomfort.  Past Medical History  Diagnosis Date  . Hyperlipidemia 2014   History reviewed. No pertinent past surgical history. Family History  Problem Relation Age of Onset  . Hyperlipidemia Mother   . Early death Father    History  Substance Use Topics  . Smoking status: Light Tobacco Smoker -- 0.25 packs/day for 30 years    Types: Cigarettes  . Smokeless tobacco: Never Used  . Alcohol Use: 0.0 oz/week    0 Standard drinks or equivalent per week     Comment: drinks about 7-9 beers every other weekend    Review of Systems  All other systems reviewed and are negative.   Allergies  Review of patient's allergies indicates no known allergies.  Home Medications   Prior to Admission medications   Medication Sig Start Date End Date Taking? Authorizing Provider  atorvastatin (LIPITOR) 10 MG tablet Take 1 tablet (10 mg total) by mouth daily. 04/04/14   Doris Cheadleeepak Advani, MD         cyclobenzaprine (FLEXERIL) 10 MG tablet Take 1 tablet (10 mg total) by mouth at bedtime as needed for muscle spasms (back pain). 04/02/14   Josalyn Funches, MD  naproxen (NAPROSYN) 500 MG tablet Take 1 tablet (500 mg total) by mouth 2 (two) times daily as needed for moderate pain (with meals). 04/02/14   Josalyn Funches, MD  pantoprazole  (PROTONIX) 40 MG tablet Take 1 tablet (40 mg total) by mouth daily before supper. 04/02/14   Josalyn Funches, MD   BP 128/90 mmHg  Pulse 68  Temp(Src) 98 F (36.7 C) (Oral)  Resp 16  SpO2 99% Physical Exam  Constitutional: He is oriented to person, place, and time. No distress.  Alert, nicely groomed  HENT:  Head: Atraumatic.  Eyes:  Conjugate gaze, no eye redness/drainage  Neck: Neck supple.  Cardiovascular: Normal rate and regular rhythm.   Pulmonary/Chest: No respiratory distress. He has no wheezes. He has no rales.  Lungs clear, symmetric breath sounds  Abdominal: Soft. He exhibits no distension.  Musculoskeletal: Normal range of motion.  No leg edema. Mild tenderness to palpation over the right mid back, no spasm. No focal swelling/bruising/rash. No bruising  Neurological: He is alert and oriented to person, place, and time.  Skin: Skin is warm and dry.  No cyanosis  Nursing note and vitals reviewed.   ED Course  Procedures (including critical care time) Labs Review Labs Reviewed - No data to display  Imaging Review Dg Chest 2 View  06/22/2014   CLINICAL DATA:  Chest pain, cough, history of pneumonia  EXAM: CHEST  2 VIEW  COMPARISON:  05/09/2013  FINDINGS: Lungs are clear.  No pleural effusion or pneumothorax.  The heart is normal in size.  Visualized osseous structures are within normal limits.  IMPRESSION: Normal chest radiographs.   Electronically Signed   By: Charline Bills M.D.   On: 06/22/2014 14:16   Pt requested something for pain while at urgent care.  Initially offered toradol injection (declined by pt, doesn't like needles).  Subsequently given ibuprofen  po.    GI cocktail given for reports of epigastric discomfort; pt with possibility of gastritis if taking NSAIDs frequently for back pain.  Duoneb breathing treatment given to address possibility of bronchospasm contributing to pain (pt smokes).    Pt in less pain, after these maneuvers.  Continues  to express certainty that his discomfort is due to pneumonia.   MDM   1. Mid back pain on right side   2. Chronic upper back pain   3. Acute bronchitis due to other specified organisms   4. Gastritis    Took naprosyn off med list, at least for now.   Pt denies taking pantoprazole currently; trial of famotidine. Albuterol MDI. FU pcp/Josalyn Funches in several days, to discuss further management of sx's.   Eustace Moore, MD 06/22/14 [phone]

## 2014-06-22 NOTE — ED Notes (Signed)
Pt    Reports   He  Has  Had  Cough  And back pain      With        Symptoms      For    sev  Days  He  Is  A   Smoker        Who  Has  Had      pnuemonia    In  Past   She  Has    Pain  When he  Coughs  The  Pain is  On the  Left  Side

## 2016-05-25 ENCOUNTER — Ambulatory Visit (INDEPENDENT_AMBULATORY_CARE_PROVIDER_SITE_OTHER): Payer: Self-pay | Admitting: Physician Assistant

## 2016-05-25 ENCOUNTER — Encounter (INDEPENDENT_AMBULATORY_CARE_PROVIDER_SITE_OTHER): Payer: Self-pay | Admitting: Physician Assistant

## 2016-05-25 VITALS — BP 114/76 | HR 73 | Temp 98.2°F | Ht 69.5 in | Wt 204.4 lb

## 2016-05-25 DIAGNOSIS — J370 Chronic laryngitis: Secondary | ICD-10-CM

## 2016-05-25 DIAGNOSIS — G8929 Other chronic pain: Secondary | ICD-10-CM | POA: Insufficient documentation

## 2016-05-25 DIAGNOSIS — F17210 Nicotine dependence, cigarettes, uncomplicated: Secondary | ICD-10-CM

## 2016-05-25 DIAGNOSIS — R070 Pain in throat: Secondary | ICD-10-CM

## 2016-05-25 MED ORDER — PREDNISONE 20 MG PO TABS: 60.0000 mg | ORAL_TABLET | Freq: Every day | ORAL | 0 refills | Status: DC

## 2016-05-25 NOTE — Patient Instructions (Signed)
Dolor de garganta (Sore Throat) El dolor de garganta es dolor, ardor, irritacin o sensacin de picazn en la garganta. Cuando usted tiene Engineer, mining de Advertising copywriter, puede sentir molestia o dolor en la garganta cuando traga o habla. Muchas cosas pueden causar dolor de garganta, entre ellas:  Una infeccin.  Alergias estacionales.  La sequedad en el aire.  Algunos irritantes, como el humo o la polucin.  Enfermedad por reflujo gastroesofgico (ERGE).  Un tumor. Generalmente es Financial risk analyst signo de otra enfermedad. Un dolor de garganta puede estar acompaado de otros sntomas, como tos, estornudos, fiebre y ganglios hinchados en el cuello. La mayor parte de los dolores de garganta desaparecen sin tratamiento mdico. INSTRUCCIONES PARA EL CUIDADO EN EL [employer] medicamentos de venta libre solamente como se lo haya indicado el mdico.  Beba suficiente lquido para Pharmacologist la orina clara o de color amarillo plido.  Descanse todo lo que sea necesario.  Para ayudar a Engineer, materials, intente lo siguiente:  Beba lquidos calientes, como caldos, infusiones o agua caliente.  Tambin puede comer o beber lquidos fros o congelados, tales como paletas de hielo congelado.  Haga grgaras con una mezcla de agua y sal 3 o 4veces al da, o cuando sea necesario. Para preparar la mezcla de agua y sal, disuelva totalmente de media a 1cucharadita de sal en 1taza de agua tibia.  Chupar caramelos duros o pastillas para la garganta.  Ponga un humidificador de vapor fro en la habitacin por la noche para humedecer el aire.  Tambin puede abrir el agua caliente de la ducha y sentarse en el bao con la puerta cerrada durante 5a79minutos.  No consuma ningn producto que contenga tabaco, lo que incluye cigarrillos, tabaco de Theatre manager y Administrator, Civil Service. Si necesita ayuda para dejar de fumar, consulte al mdico. SOLICITE ATENCIN MDICA SI:  Tiene fiebre por ms de 2 a 3 das.  Tiene  fiebre o sntomas que duran (son persistentes) ms de 2 a 3 das.  El dolor de garganta no mejora en el trmino de [address].  Tiene fiebre y los sntomas empeoran repentinamente. SOLICITE ATENCIN MDICA DE INMEDIATO SI:  Tiene dificultad para respirar.  No puede tragar lquidos, alimentos blandos o la saliva.  Tiene ms inflamacin en la garganta o en el cuello.  Tiene nuseas o vmitos persistentes. Esta informacin no tiene Theme park manager el consejo del mdico. Asegrese de hacerle al mdico cualquier pregunta que tenga. Document Released: 01/10/2005 Document Revised: 05/04/2015 Document Reviewed: 10/31/2014 Elsevier Interactive Patient Education  2017 [employer].

## 2016-05-25 NOTE — Progress Notes (Signed)
Subjective:  Patient ID: Dustin Cox, male    DOB: [birth year]  Age: 46 y.o. MRN: [id number]  CC: sore throat  HPI Dustin Cox is a 46 y.o. male with a PMH of hyperlipidemia, tobacco abuse, and alcohol abuse presents with six month history of throat pain. Several episodes of laryngitis associated with throat pain.  Pain 5/10 daily, worse with palpation. Denies odynophagia, dysphagia, GERD, SOB, cough, f/c/n/v, bleeding, swelling of tongue, drooling, rash, GI/GU sxs, fatigue, or malaise.   Outpatient Medications Prior to Visit  Medication Sig Dispense Refill  . albuterol (PROVENTIL HFA;VENTOLIN HFA) 108 (90 BASE) MCG/ACT inhaler Inhale 2 puffs into the lungs every 4 (four) hours as needed for wheezing or shortness of breath. (Patient not taking: Reported on 05/25/2016) 1 Inhaler 0  . atorvastatin (LIPITOR) 10 MG tablet Take 1 tablet (10 mg total) by mouth daily. (Patient not taking: Reported on 05/25/2016) 90 tablet 3  . cyclobenzaprine (FLEXERIL) 10 MG tablet Take 1 tablet (10 mg total) by mouth at bedtime as needed for muscle spasms (back pain). (Patient not taking: Reported on 05/25/2016) 30 tablet 0  . famotidine (PEPCID) 40 MG tablet Take 1 tablet (40 mg total) by mouth daily. 30 tablet 0  . pantoprazole (PROTONIX) 40 MG tablet Take 1 tablet (40 mg total) by mouth daily before supper. (Patient not taking: Reported on 05/25/2016) 30 tablet 5   No facility-administered medications prior to visit.      ROS Review of Systems  Constitutional: Negative for chills, fever and malaise/fatigue.  HENT:       Throat pain  Eyes: Negative for blurred vision.  Respiratory: Negative for shortness of breath.   Cardiovascular: Negative for chest pain and palpitations.  Gastrointestinal: Negative for abdominal pain and nausea.  Genitourinary: Negative for dysuria and hematuria.  Musculoskeletal: Negative for joint pain and myalgias.  Skin: Negative for rash.  Neurological:  Negative for tingling and headaches.  Psychiatric/Behavioral: Negative for depression. The patient is not nervous/anxious.     Objective:  BP 114/76 (BP Location: Left Arm, Patient Position: Sitting, Cuff Size: Normal)   Pulse 73   Temp 98.2 F (36.8 C) (Oral)   Ht 5' 9.5" (1.765 m)   Wt 204 lb 6.4 oz (92.7 kg)   SpO2 95%   BMI 29.75 kg/m   BP/Weight 05/25/2016 06/22/2014 04/02/2014  Systolic BP 114 128 101  Diastolic BP 76 90 64  Wt. (Lbs) 204.4 - 188  BMI 29.75 - 26.98      Physical Exam  Constitutional: He is oriented to person, place, and time.  Well developed, well nourished, NAD, polite  HENT:  Head: Normocephalic and atraumatic.  Oropharynx with mildl erythema. No exudates, no tonsillar swelling.  Eyes: No scleral icterus.  Neck: Normal range of motion. Neck supple. No thyromegaly present.  Cardiovascular: Normal rate, regular rhythm and normal heart sounds.   Pulmonary/Chest: Effort normal and breath sounds normal.  Musculoskeletal: He exhibits no edema.  Neurological: He is alert and oriented to person, place, and time.  Skin: Skin is warm and dry. No rash noted. No erythema. No pallor.  Psychiatric: He has a normal mood and affect. His behavior is normal. Thought content normal.  Vitals reviewed.    Assessment & Plan:   1. Chronic throat pain - Begin predniSONE (DELTASONE) 20 MG tablet; Take 3 tablets (60 mg total) by mouth daily with breakfast.  Dispense: 21 tablet; Refill: 0 - Ambulatory referral to ENT  2. Laryngitis, chronic - Begin predniSONE (  DELTASONE) 20 MG tablet; Take 3 tablets (60 mg total) by mouth daily with breakfast.  Dispense: 21 tablet; Refill: 0 - Ambulatory referral to ENT  3. Light smoker - Ambulatory referral to ENT   Meds ordered this encounter  Medications  . predniSONE (DELTASONE) 20 MG tablet    Sig: Take 3 tablets (60 mg total) by mouth daily with breakfast.    Dispense:  21 tablet    Refill:  0    Order Specific Question:    Supervising Provider    Answer:   Quentin Angst [[id number]]    Follow-up: Return in about 2 weeks (around 06/08/2016) for full physical.   Loletta Specter PA

## 2016-05-26 MED FILL — ?PREDNISONE 20 MG TABLET: 20 | 7 days supply | Qty: 21 | Fill #0

## 2016-06-09 ENCOUNTER — Encounter (INDEPENDENT_AMBULATORY_CARE_PROVIDER_SITE_OTHER): Payer: Self-pay | Admitting: Physician Assistant

## 2016-06-09 ENCOUNTER — Ambulatory Visit (INDEPENDENT_AMBULATORY_CARE_PROVIDER_SITE_OTHER): Payer: Self-pay | Admitting: Physician Assistant

## 2016-06-09 VITALS — BP 137/98 | HR 67 | Temp 97.6°F | Ht 70.0 in | Wt 200.2 lb

## 2016-06-09 DIAGNOSIS — G8929 Other chronic pain: Secondary | ICD-10-CM

## 2016-06-09 DIAGNOSIS — E119 Type 2 diabetes mellitus without complications: Secondary | ICD-10-CM

## 2016-06-09 DIAGNOSIS — Z0000 Encounter for general adult medical examination without abnormal findings: Secondary | ICD-10-CM

## 2016-06-09 DIAGNOSIS — Z23 Encounter for immunization: Secondary | ICD-10-CM

## 2016-06-09 DIAGNOSIS — Z131 Encounter for screening for diabetes mellitus: Secondary | ICD-10-CM

## 2016-06-09 DIAGNOSIS — R070 Pain in throat: Secondary | ICD-10-CM

## 2016-06-09 LAB — POCT URINALYSIS DIPSTICK
BILIRUBIN UA: NEGATIVE
Blood, UA: NEGATIVE
GLUCOSE UA: NEGATIVE
Ketones, UA: NEGATIVE
Leukocytes, UA: NEGATIVE
Nitrite, UA: NEGATIVE
Protein, UA: NEGATIVE
Spec Grav, UA: 1.015 (ref 1.010–1.025)
Urobilinogen, UA: 0.2 E.U./dL
pH, UA: 6 (ref 5.0–8.0)

## 2016-06-09 LAB — POCT GLYCOSYLATED HEMOGLOBIN (HGB A1C): Hemoglobin A1C: 6.7

## 2016-06-09 MED ORDER — METFORMIN HCL 500 MG PO TABS: 500.0000 mg | ORAL_TABLET | Freq: Two times a day (BID) | ORAL | 3 refills | Status: DC

## 2016-06-09 MED FILL — ?METFORMIN HCL 500MG TABLET: 500 | 30 days supply | Qty: 60 | Fill #0

## 2016-06-09 NOTE — Progress Notes (Signed)
  Subjective:  Patient ID: Dustin Cox, male    DOB: 07/22/1970  Age: 46 y.o. MRN: [id number]  CC: annual exam  HPI Dustin Cox is a 46 y.o. male with a PMH of hyperlipidemia and chronic throat pain presents for annual physical. Was seen here 15 days ago and diagnosed with chronic throat pain. He was prescribed prednisone and states that he feels drastically better. Still has some submandibular pain. Has a referral already made to ENT but has not heard from them yet. Otherwise, says he feels well.    ROS Review of Systems  Constitutional: Negative for chills, fever and malaise/fatigue.  Eyes: Negative for blurred vision.  Respiratory: Negative for shortness of breath.   Cardiovascular: Negative for chest pain and palpitations.  Gastrointestinal: Negative for abdominal pain and nausea.  Genitourinary: Negative for dysuria and hematuria.  Musculoskeletal: Negative for joint pain and myalgias.  Skin: Negative for rash.  Neurological: Negative for tingling and headaches.  Psychiatric/Behavioral: Negative for depression. The patient is not nervous/anxious.     Objective:  BP (!) 137/98 (BP Location: Left Arm, Patient Position: Sitting, Cuff Size: Normal)   Pulse 67   Temp 97.6 F (36.4 C) (Oral)   Ht 5\' 10"  (1.778 m)   Wt 200 lb 3.2 oz (90.8 kg)   SpO2 96%   BMI 28.73 kg/m   BP/Weight 06/09/2016 05/25/2016 06/22/2014  Systolic BP 137 114 128  Diastolic BP 98 76 90  Wt. (Lbs) 200.2 204.4 -  BMI 28.73 29.75 -      Physical Exam  Constitutional: He is oriented to person, place, and time.  Well developed, overweight, NAD, polite  HENT:  Head: Normocephalic and atraumatic.  Right Ear: External ear normal.  Left Ear: External ear normal.  Mouth/Throat: No oropharyngeal exudate.  TMs normal  Eyes: No scleral icterus.  Neck: Normal range of motion. Neck supple. No thyromegaly present.  Cardiovascular: Normal rate, regular rhythm and normal heart  sounds.   Pulmonary/Chest: Effort normal and breath sounds normal.  Abdominal: Soft. Bowel sounds are normal. He exhibits no distension and no mass. There is no tenderness. There is no rebound and no guarding.  Genitourinary:  Genitourinary Comments: Penis normal, testicles normal/no nodules. Prostate 20g, normal consistency, no nodules, no tenderness to palpation.  Musculoskeletal: He exhibits no edema.  LE and UE with full aROM bilaterally. Back with full aROM. No pain elicited.  Lymphadenopathy:    He has no cervical adenopathy.  Neurological: He is alert and oriented to person, place, and time. He has normal reflexes. No cranial nerve deficit. Coordination normal.  Skin: Skin is warm and dry. No rash noted. No erythema. No pallor.  Psychiatric: He has a normal mood and affect. His behavior is normal. Thought content normal.  Vitals reviewed.    Assessment & Plan:   1. Annual physical exam - CBC with Differential - Comprehensive metabolic panel - Lipid Panel - PSA  2. Screening for diabetes mellitus - HgB A1c 6.7% in clinic today.  3. DM2 - Begin Metformin 500 mg BID - Pt counseled on nutrition and low carb diet  4. Need for Tdap vaccination - Tdap vaccine greater than or equal to 7yo IM  5. Chronic throat pain - Much better after Prednisone but not completely resolved. - Still awaiting ENT appointment.    Follow-up: Diabetes  Loletta Specteroger David Gomez PA

## 2016-06-09 NOTE — Patient Instructions (Addendum)
Anlisis de antgeno prosttico especfico (Prostate-Specific Antigen Test) POR QU ME DEBO REALIZAR ESTE ANLISIS? El anlisis de antgeno prosttico especfico (PSA) se realiza para Production assistant, radiodeterminar la cantidad de PSA que tiene Risk managerla sangre. El PSA es un tipo de protena que generalmente est presente en la prstata. Ciertas enfermedades pueden hacer que los niveles sanguneos de PSA aumenten, tales como:  Infeccin de la prstata (prostatitis).  Agrandamiento de la prstata (hipertrofia).  Cncer de prstata. Dado que los niveles de PSA aumentan mucho a causa del cncer de prstata, este anlisis se puede usar para confirmar un diagnstico de Firefighterese cncer. Tambin se puede usar para Sales executivecontrolar el tratamiento para Management consultantel cncer de prstata y para ver si el cncer de prstata vuelve a aparecer despus de que el tratamiento ha finalizado. Este ARAMARK Corporationanlisis tiene un ndice muy alto de [place]resultados positivos falsos. Por lo tanto, ya no se recomienda la prueba de deteccin de rutina del PSA para todos los hombres. Este tipo de resultado es incorrecto porque indica que una enfermedad o un hallazgo est presente cuando en realidad no lo est. QU TIPO DE MUESTRA SE TOMA? Para esta prueba, se extrae Lauris Poaguna muestra de Clay Springssangre. Por lo general, para extraerla, se introduce una aguja en una vena o se pincha un dedo con una aguja pequea. CMO DEBO PREPARARME PARA ESTE ANLISIS? No se requiere preparacin para este anlisis. Sin embargo, Therapist, musicexisten otros factores que pueden afectar los resultados del Cayugaanlisis de PSA. Para obtener los resultados ms precisos:  Higher education careers adviservite hacerse un examen rectal en el perodo de varias horas previas a la extraccin de sangre para este anlisis.  Evite cualquier procedimiento en la prstata en el perodo de las 6 semanas previas a este anlisis.  Evite eyacular dentro de las 24horas previas a este anlisis.  Infrmele al mdico si tuvo una infeccin urinaria reciente.  Infrmele al mdico si est  tomando medicamentos para ayudar a que le crezca el pelo, como finasterida.  Infrmele al mdico si ha estado expuesto a un medicamento llamado dietilestilbestrol. Infrmele al mdico si cualquiera de estos factores corresponde a su caso. Es posible que se le pida que reprograme el [place] Creekanlisis. QU SON LOS VALORES DE REFERENCIA? Los valores de referencia se establecen despus de realizarle el anlisis a un grupo grande de Dealerpersonas. Pueden variar American Family Insuranceentre diferentes personas, laboratorios y hospitales. Es su responsabilidad retirar el resultado del Iron Cityestudio. Consulte en el laboratorio o en el departamento en el que fue realizado el estudio cundo y cmo podr Starbucks Corporationobtener los resultados.  Bajo: de 0 a 2,5ng/ml.  Levemente a moderadamente elevado: de 2,6 a 10,0ng/ml.  Moderadamente elevado: de 10,0 a 19,9ng/ml.  Muy elevado: 20ng/ml o ms. QU SIGNIFICAN LOS RESULTADOS? En la Harley-Davidsonmayora de los hombres con cncer de prstata, los resultados del Rainieranlisis de PSA son mayores de 4ng/ml. Si el resultado del anlisis est por encima de [address] nivel, esto puede indicar un aumento del riesgo de cncer de prstata. Un aumento en los niveles de PSA tambin puede indicar otras enfermedades. Hable con el mdico Dole Foodsobre los resultados, las opciones de tratamiento y, si es necesario, la necesidad de Education officer, environmentalrealizar ms [place] Cityestudios. Hable con el mdico si tiene Dynegyalguna pregunta sobre los resultados. Esta informacin no tiene Theme park managercomo fin reemplazar el consejo del mdico. Asegrese de hacerle al mdico cualquier pregunta que tenga. Document Released: 10/31/2012 Document Revised: 01/31/2014 Document Reviewed: 06/05/2013 Elsevier Interactive Patient Education  2017 Elsevier Inc. La diabetes mellitus y los alimentos (Diabetes Mellitus and Food) Es importante que controle su  nivel de azcar en la sangre (glucosa). El nivel de glucosa en sangre depende en gran medida de lo que usted come. Comer alimentos saludables en las cantidades [place]  a lo largo del Futures trader, aproximadamente a la misma hora CarMax, lo ayudar a Chief Operating Officer su nivel de Event organiser. Tambin puede ayudarlo a retrasar o Fish farm manager de la diabetes mellitus. Comer de Regions Financial Corporation saludable incluso puede ayudarlo a Event organiser de presin arterial y a Barista o Pharmacologist un peso saludable. Entre las recomendaciones generales para alimentarse y Water quality scientist los alimentos de forma saludable, se incluyen las siguientes:  Respetar las comidas principales y comer colaciones con regularidad. Evitar pasar largos perodos sin comer con el fin de perder peso.  Seguir una dieta que consista principalmente en alimentos de origen vegetal, como frutas, vegetales, frutos secos, legumbres y cereales integrales.  Utilizar mtodos de coccin a baja temperatura, como hornear, en lugar de mtodos de coccin a alta temperatura, como frer en abundante aceite. Trabaje con el nutricionista para aprender a Acupuncturist nutricional de las etiquetas de los alimentos. CMO PUEDEN AFECTARME LOS ALIMENTOS? Carbohidratos Los carbohidratos afectan el nivel de glucosa en sangre ms que cualquier otro tipo de alimento. El nutricionista lo ayudar a Chief Strategy Officer cuntos carbohidratos puede consumir en cada comida y ensearle a contarlos. El recuento de carbohidratos es importante para mantener la glucosa en sangre en un nivel saludable, en especial si utiliza insulina o toma determinados medicamentos para la diabetes mellitus. Alcohol El alcohol puede provocar disminuciones sbitas de la glucosa en sangre (hipoglucemia), en especial si utiliza insulina o toma determinados medicamentos para la diabetes mellitus. La hipoglucemia es una afeccin que puede poner en peligro la vida. Los sntomas de la hipoglucemia (somnolencia, mareos y Administrator) son similares a los sntomas de haber consumido mucho alcohol. Si el mdico lo autoriza a beber alcohol, hgalo con moderacin y siga estas  pautas:  Las mujeres no deben beber ms de un trago por da, y los hombres no deben beber ms de dos tragos por Futures trader. Un trago es igual a:  12 onzas (355 ml) de cerveza  5 onzas de vino (150 ml) de vino  1,5onzas (45ml) de bebidas espirituosas  No beba con el estmago vaco.  Mantngase hidratado. Beba agua, gaseosas dietticas o t helado sin azcar.  Las gaseosas comunes, los jugos y otros refrescos podran contener muchos carbohidratos y se Heritage manager. QU ALIMENTOS NO SE RECOMIENDAN? Cuando haga las elecciones de alimentos, es importante que recuerde que todos los alimentos son distintos. Algunos tienen menos nutrientes que otros por porcin, aunque podran tener la misma cantidad de caloras o carbohidratos. Es difcil darle al cuerpo lo que necesita cuando consume alimentos con menos nutrientes. Estos son algunos ejemplos de alimentos que debera evitar ya que contienen muchas caloras y carbohidratos, pero pocos nutrientes:  Neurosurgeon trans (la mayora de los alimentos procesados incluyen grasas trans en la etiqueta de Informacin nutricional).  Gaseosas comunes.  Jugos.  Caramelos.  Dulces, como tortas, pasteles, rosquillas y [place].  Comidas fritas. QU ALIMENTOS PUEDO COMER? Consuma alimentos ricos en nutrientes, que nutrirn el cuerpo y lo mantendrn saludable. Los alimentos que debe comer tambin dependern de varios factores, como:  Las caloras que necesita.  Los medicamentos que toma.  Su peso.  El nivel de glucosa en [place].  El [place] de presin arterial.  El nivel de colesterol. Debe consumir una amplia variedad de alimentos, por ejemplo:  Protenas.  Cortes de Target Corporation.  Protenas con bajo contenido de grasas saturadas, como pescado, clara de huevo y frijoles. Evite las carnes procesadas.  Frutas y vegetales.  Frutas y Sports administrator que pueden ayudar a Chief Operating Officer los niveles sanguneos de [place], como [place], mangos y batatas.  Productos  lcteos.  Elija productos lcteos sin grasa o con bajo contenido de [place], como [place], yogur y [place].  Cereales, panes, pastas y arroz.  Elija cereales integrales, como panes multicereales, avena en grano y arroz integral. Estos alimentos pueden ayudar a controlar la presin arterial.  Rosalin Hawking.  Alimentos que contengan grasas saludables, como frutos secos, Chartered certified accountant, aceite de [place], aceite de canola y pescado. TODOS LOS QUE PADECEN DIABETES MELLITUS TIENEN EL MISMO PLAN DE COMIDAS? Dado que todas las personas que padecen diabetes mellitus son distintas, no hay un solo plan de comidas que funcione para todos. Es muy importante que se rena con un nutricionista que lo ayudar a crear un plan de comidas adecuado para usted. Esta informacin no tiene Theme park manager el consejo del mdico. Asegrese de hacerle al mdico cualquier pregunta que tenga. Document Released: 04/19/2007 Document Revised: 01/31/2014 Document Reviewed: 12/07/2012 Elsevier Interactive Patient Education  2017 ArvinMeritor.

## 2016-06-10 LAB — CBC WITH DIFFERENTIAL/PLATELET
BASOS: 0 %
Basophils Absolute: 0 10*3/uL (ref 0.0–0.2)
EOS (ABSOLUTE): 0.2 10*3/uL (ref 0.0–0.4)
EOS: 3 %
HEMATOCRIT: 48.3 % (ref 37.5–51.0)
HEMOGLOBIN: 16.2 g/dL (ref 13.0–17.7)
Immature Grans (Abs): 0 10*3/uL (ref 0.0–0.1)
Immature Granulocytes: 0 %
Lymphocytes Absolute: 2.3 10*3/uL (ref 0.7–3.1)
Lymphs: 35 %
MCH: 29.8 pg (ref 26.6–33.0)
MCHC: 33.5 g/dL (ref 31.5–35.7)
MCV: 89 fL (ref 79–97)
Monocytes Absolute: 0.5 10*3/uL (ref 0.1–0.9)
Monocytes: 8 %
NEUTROS ABS: 3.5 10*3/uL (ref 1.4–7.0)
Neutrophils: 54 %
PLATELETS: 170 10*3/uL (ref 150–379)
RBC: 5.43 x10E6/uL (ref 4.14–5.80)
RDW: 14.1 % (ref 12.3–15.4)
WBC: 6.5 10*3/uL (ref 3.4–10.8)

## 2016-06-10 LAB — COMPREHENSIVE METABOLIC PANEL
ALBUMIN: 4.6 g/dL (ref 3.5–5.5)
ALT: 29 IU/L (ref 0–44)
AST: 27 IU/L (ref 0–40)
Albumin/Globulin Ratio: 2.3 — ABNORMAL HIGH (ref 1.2–2.2)
Alkaline Phosphatase: 76 IU/L (ref 39–117)
BILIRUBIN TOTAL: 0.7 mg/dL (ref 0.0–1.2)
BUN / CREAT RATIO: 12 (ref 9–20)
BUN: 12 mg/dL (ref 6–24)
CALCIUM: 9.3 mg/dL (ref 8.7–10.2)
CHLORIDE: 102 mmol/L (ref 96–106)
CO2: 19 mmol/L (ref 18–29)
Creatinine, Ser: 0.98 mg/dL (ref 0.76–1.27)
GFR calc Af Amer: 108 mL/min/{1.73_m2} (ref >59)
GFR, EST NON AFRICAN AMERICAN: 93 mL/min/{1.73_m2} (ref >59)
GLOBULIN, TOTAL: 2 g/dL (ref 1.5–4.5)
Glucose: 128 mg/dL — ABNORMAL HIGH (ref 65–99)
Potassium: 4.6 mmol/L (ref 3.5–5.2)
SODIUM: 142 mmol/L (ref 134–144)
Total Protein: 6.6 g/dL (ref 6.0–8.5)

## 2016-06-10 LAB — LIPID PANEL
Chol/HDL Ratio: 5.5 ratio — ABNORMAL HIGH (ref 0.0–5.0)
Cholesterol, Total: 244 mg/dL — ABNORMAL HIGH (ref 100–199)
HDL: 44 mg/dL (ref >39)
Triglycerides: 424 mg/dL — ABNORMAL HIGH (ref 0–149)

## 2016-06-10 LAB — PSA: PROSTATE SPECIFIC AG, SERUM: 0.6 ng/mL (ref 0.0–4.0)

## 2016-06-14 ENCOUNTER — Other Ambulatory Visit (INDEPENDENT_AMBULATORY_CARE_PROVIDER_SITE_OTHER): Payer: Self-pay | Admitting: Physician Assistant

## 2016-06-14 DIAGNOSIS — E781 Pure hyperglyceridemia: Secondary | ICD-10-CM

## 2016-06-14 MED ORDER — GEMFIBROZIL 600 MG PO TABS: 600.0000 mg | ORAL_TABLET | Freq: Two times a day (BID) | ORAL | 1 refills | Status: DC

## 2016-06-14 MED FILL — GEMFIBROZIL 600 MG TABLET: 600 | 30 days supply | Qty: 60 | Fill #0

## 2016-06-14 NOTE — Progress Notes (Signed)
Elevated trigs.

## 2016-06-17 ENCOUNTER — Ambulatory Visit (INDEPENDENT_AMBULATORY_CARE_PROVIDER_SITE_OTHER): Payer: Self-pay

## 2016-07-12 MED FILL — ?METFORMIN HCL 500MG TABLET: 500 | 30 days supply | Qty: 60 | Fill #1

## 2016-07-12 MED FILL — GEMFIBROZIL 600 MG TABLET: 600 | 30 days supply | Qty: 60 | Fill #1

## 2016-08-16 MED FILL — GEMFIBROZIL 600 MG TABLET: 600 | 30 days supply | Qty: 60 | Fill #2

## 2016-08-16 MED FILL — ?METFORMIN HCL 500MG TABLET: 500 | 30 days supply | Qty: 60 | Fill #2

## 2016-09-09 ENCOUNTER — Encounter (INDEPENDENT_AMBULATORY_CARE_PROVIDER_SITE_OTHER): Payer: Self-pay | Admitting: Physician Assistant

## 2016-09-09 ENCOUNTER — Ambulatory Visit (INDEPENDENT_AMBULATORY_CARE_PROVIDER_SITE_OTHER): Payer: Self-pay | Admitting: Physician Assistant

## 2016-09-09 VITALS — BP 122/85 | HR 68 | Temp 97.9°F | Wt 188.4 lb

## 2016-09-09 DIAGNOSIS — E781 Pure hyperglyceridemia: Secondary | ICD-10-CM

## 2016-09-09 DIAGNOSIS — E119 Type 2 diabetes mellitus without complications: Secondary | ICD-10-CM

## 2016-09-09 DIAGNOSIS — F1510 Other stimulant abuse, uncomplicated: Secondary | ICD-10-CM

## 2016-09-09 DIAGNOSIS — Z23 Encounter for immunization: Secondary | ICD-10-CM

## 2016-09-09 DIAGNOSIS — F1010 Alcohol abuse, uncomplicated: Secondary | ICD-10-CM

## 2016-09-09 LAB — POCT GLYCOSYLATED HEMOGLOBIN (HGB A1C): Hemoglobin A1C: 6.1

## 2016-09-09 MED ORDER — DISULFIRAM 250 MG PO TABS: 250.0000 mg | ORAL_TABLET | Freq: Every day | ORAL | 0 refills | Status: DC

## 2016-09-09 MED ORDER — METFORMIN HCL 500 MG PO TABS: 500.0000 mg | ORAL_TABLET | Freq: Two times a day (BID) | ORAL | 3 refills | Status: DC

## 2016-09-09 MED FILL — ?METFORMIN HCL 500MG TABLET: 500 | 30 days supply | Qty: 60 | Fill #0

## 2016-09-09 MED FILL — ACAMPROSATE CALC DR 333 MG: 333 | 30 days supply | Qty: 90 | Fill #0

## 2016-09-09 NOTE — Patient Instructions (Signed)
Community Resources  Advocacy/Legal Legal Aid [place]:  1-866-219-5262  /  336-272-0148  Family Justice Center:  336-641-7233  [facility] [place] 24-hr Crisis line:  336-273-7273  Women's Resource Center, [place]:  336-275-6090  Court Watch (custody):  336-275-2346  [place] [facility]:   336-279-9299    Baby & Breastfeeding Car Seat Inspection @ Various [place] Fire Depts.- call [phone]  [place] [facility]  336-832-6860  [facility] [phone]  WIC: 336-641-3663 ([place]);  336-641-7571 ([place])  La Leche League:  1-877-452-5321   Childcare [place] [facility]: 336-369-5097 ([place]) / 336-887-8224 ([place])  - Child Care Resources/ Referrals/ Scholarships  - Head Start/ Early Head Start (call or apply online)  [place] DHHS: [place] Pre-K :  1-800-859-0829 / 336-274-5437   Employment / Job Search [facility] [place]: 336-275-6090 / [address]  [place] Works Career Center (JobLink): 336-373-5922 ([place]) / 336-882-4141 ([place])  [facility] Resource/ Career Center: 336-275-9801 / 336-282-7307  [facility] Job & Career Center: 336-373-3764  DHHS Work First: 336-641-3447 ([place]) / 336-641-3447 ([place])  [facility] [place]:  336-676-5871   Financial Assistance [facility] [place] [facility]:  336-553-2657  [facility]: 336-235-0368  [place] Network (furniture):  336-370-4002  [place] Helping Hands: 336-373-4264  Low Income Energy Assistance  336-641-3000   Food Assistance DHHS- SNAP/ Food Stamps: 336-641-4588  WIC: [place]- 336-641-3663 ;  [place] 336-641-7571  Little Green Book- Free Meals  Little Blue Book- Free Food Pantries  During the summer, text "FOOD" to 877877   General Health / Clinics (Adults) Orange Card (for Adults) through [facility]: (336) 895-4900  [facility]:   336-832-8035  [facility]:   336-832-4444  Health Department:  336-641-3245  [facility]:  336-415-3877 / 336-641-2100  Planned [facility] of [place]:   336-373-0678  [facility]:   336-334-4822 x 50251   Housing [place] Housing Coalition:   336-691-9521  [place] Housing Authority:  336-275-8501  Affordable Housing Managemnt:  336-273-0568   Immigrant/ Refugee Center for [place]ians (UNCG):  336-256-1065  Faith Action International House:  336-379-0037  [facility]als Institute:  336-937-4701  Church World Services:  336-617-0381  African Services Coalition:  336-574-2677   LGBTQ YouthSAFE  www.youthsafe[place].org  PFLAG  336-541-6754 / info@[place].org  The Trevor Project:  1-866-488-7386   Mental Health/ Substance Use [facility]  336-387-6161  [facility]:  336-832-9700 or 1-800-711-2635  [facility]'s Circle of Care:  336-271-5888  [facility]:  336-294-1349  [facility] Care Services:  336-542-2884  [facility] (walk-ins)  336-676-6840 / [address]  [place]:  800-449-1287  Alcoholics Anonymous:  336-854-4278  Narcotics Anonymous:  800-365-1036  Quit Smoking Hotline:  800-QUIT-NOW (800-784-8669)   Parenting Children's Home Society:  800-632-1400  [place]: Education Center & Support Groups:  336-832-6682  YWCA: 336-273-3461  UNCG: Bringing Out the Best:  336-334-3120               Thriving at Three (Hispanic families): 336-256-1066  Healthy Start ([facility] [place]):  336-387-6161 x2288  Parents as Teachers:  336-691-0024  [place] [facility]- Learning Together (Immigrants): 336-369-5001   Poison Control 800-222-1222  Sports & Recreation [facility] Open Doors Application: ymcanwnc.org/join/open-doors-financial-assistance/  City of [place] Recreation Centers: http://www.[place].gov/index.aspx?page=3615   Special Needs Family Support Network:  336-832-6507  Autism Society of :   336-333-0197 x1402 or x1412 /  800-785-1035  [facility] [place]:  336-334-5773     ARC of [place]:  239-721-5082  Children's Developmental Service Agency (CDSA):  276-460-9613  [facility] (Care Coordination for Children):  986-630-5222   Transportation Medicaid Transportation: [phone] 076-9532 to apply  [place] Authority: 408 300 3707 (reduced-fare bus ID to Medicaid/ Medicare/ Orange Card)  SCAT Paratransit services: Eligible riders only, call 904-430-4261 for application   Tutoring/Mentoring [facility] Child Development Institute: 615 449 7644  [facility] Brothers/ Big Sisters: 380-733-3691 782-280-0919 ([place])  ACES through child's school: 4848092548  [employer] Achievers: contact your local [place] Dys Mentor Program: 3316369377   Intoxicacin alcohlica Alcohol Intoxication La intoxicacin alcohlica ocurre cuando una persona ya no piensa con claridad ni se desempea bien (experimenta un deterioro) despus de beber bebidas alcohlicas. La intoxicacin alcohlica puede ocurrir incluso con una copa. El nivel de deterioro depende de lo siguiente:  La cantidad de alcohol que la persona tom.  La edad, el sexo y el peso de Dealer.  La frecuencia con la que la persona bebe.  Si la persona tiene Administrator, sports, tales como diabetes, convulsiones o una afeccin cardaca.  La intoxicacin alcohlica puede variar en gravedad, desde leve hasta grave. La afeccin puede ser peligrosa, especialmente cuando se beben grandes cantidades de alcohol en un corto perodo de tiempo (borrachera) o si la persona tambin tom medicamentos recetados o consumi drogas recreativas. Cules son los signos o los sntomas? Los sntomas de intoxicacin alcohlica leve incluyen los siguientes:  Sensacin de relajacin o somnolencia.  Leve dificultad con: ? La coordinacin. ? El habla. ? Guardian Life Insurance. ? La atencin.  Los sntomas de intoxicacin alcohlica moderada incluyen los siguientes:  Emociones extremas, como enojo o tristeza.  Dificultad moderada con: ? La coordinacin. ? El  habla. ? Guardian Life Insurance. ? La atencin.  Los sntomas de intoxicacin alcohlica grave incluyen los siguientes:  Desmayo.  Vmitos.  Confusin.  Respiracin lenta.  Coma.  Dificultad grave con: ? La coordinacin. ? El habla. ? Guardian Life Insurance. ? La atencin.  La intoxicacin alcohlica, especialmente en personas que no estn expuestas al alcohol, con frecuencia puede avanzar de leve a grave rpidamente y [place] puede causar coma o la muerte. Cmo se diagnostica? Esta afeccin se puede diagnosticar en funcin de lo siguiente:  Los antecedentes mdicos.  Un examen fsico.  Un anlisis de sangre que mide la concentracin de alcohol en la sangre (blood alcohol content, BAC).  Si hay olor a alcohol en la respiracin.  Su mdico le preguntar la cantidad y el tipo de bebida alcohlica que bebi. Cmo se trata? Generalmente, no se requiere un tratamiento para esta afeccin. La mayora de los efectos del alcohol son temporales y desaparecen a medida que el alcohol deja el cuerpo de forma natural. El mdico puede recomendar un monitoreo hasta que el nivel de alcohol comience a Publishing copy y sea Primary school teacher a casa. Tambin es posible que le administren lquidos a travs de una va intravenosa (IV) para ayudar a Statistician. Si la intoxicacin alcohlica es grave, es posible que se necesite una mquina para respirar denominada "ventilador" para ayudarle a Industrial/product designer. Siga estas instrucciones en su casa:  No conduzca vehculos despus de beber alcohol.  Pdale a alguna persona que se quede con usted mientras est [place]. No debe quedarse solo.  Mantngase hidratado. Beba suficiente lquido para Photographer orina clara o de color amarillo plido.  Evite la cafena ya que puede deshidratarlo.  Tome los medicamentos de venta libre y los recetados solamente como se lo haya indicado el mdico. Cmo se evita? Para evitar  la intoxicacin alcohlica:  Limite el consumo de alcohol a  no ms de 1 medida por da si es mujer y no est Orthoptist y a 2 medidas por da si es hombre. Una medida equivale a 12onzas de cerveza, 5onzas de vino o 1onzas de bebidas alcohlicas de alta graduacin.  No beba alcohol con el estmago vaco.  Evite el consumo de alcohol si: ? No tiene la edad legal para beber. ? Est embarazada o podra estarlo. ? Est tomando medicamentos que no se deben tomar con alcohol. ? Beber empeora su enfermedad. ? Tiene que conducir o Education officer, environmental actividades que requieren su atencin. ? Tiene un trastorno por abuso de sustancias.  Para evitar las complicaciones posiblemente graves de la intoxicacin Therapist, sports, busque atencin mdica de inmediato si usted o una persona que conoce tiene signos de intoxicacin alcohlica moderada o grave. Estos incluyen los siguientes:  Dificultad moderada o grave con: ? La coordinacin. ? El habla. ? Guardian Life Insurance. ? La atencin.  Desmayo.  Confusin.  Vmitos.  No deje a una persona sola si est embriagada. Comunquese con un mdico si:  No mejora luego de Time Warner.  Tiene problemas en el trabajo, la escuela o en su casa debido al consumo de alcohol. Solicite ayuda de inmediato si:  Se siente inestable cuando intenta abandonar el consumo de alcohol.  Comienza a temblar de manera incontrolable (tiene convulsiones).  Vomita sangre. La sangre en el vmito puede ser de color rojo brillante o similar a los granos de caf.  Maxie Better en la materia fecal. La sangre en la materia fecal puede ser de color rojo brillante o hacer que la materia fecal se vea de color negro alquitranado y huela mal.  Se siente mareado o se desmaya. Si alguna vez siente que puede lastimarse o Physicist, medical a los dems, o tiene pensamientos de poner fin a su vida, busque ayuda de inmediato. Puede dirigirse al servicio de urgencias ms cercano o comunicarse con:  El servicio de Sports administrator de su localidad (911 en los [place]).  Una  lnea de asistencia al suicida y Visual merchandiser en crisis, como la Murphy Oil de Prevencin del Suicidio (National Suicide Prevention Lifeline) al (620)875-6958. Est disponible las 24 horas del da.  Esta informacin no tiene Theme park manager el consejo del mdico. Asegrese de hacerle al mdico cualquier pregunta que tenga. Document Released: 01/10/2005 Document Revised: 12/07/2015 Document Reviewed: 06/29/2015 Elsevier Interactive Patient Education  2018 Arvin[employer]itor.

## 2016-09-09 NOTE — Progress Notes (Signed)
Subjective:  Patient ID: Dustin Cox, male    DOB: 1970/11/15  Age: 46 y.o. MRN: [id number]  CC: f/u DM  HPI Dustin Cox is a 46 y.o. male with a medical hx of HLD and DM2 presents to f/u on DM2.  A1c 6.7% on 06/09/16. Prescribed Metformin 500 mg BID. Taking Metformin as directed. Does not check glucometer reading at home. Says he has been feeling depressed about receiving a diabetes diagnosis. Has begun to drink alcohol again. Has used crystal meth at times of inebriation. Says he has been using gemfibrozil on occasion and is trying to cut down on fatty foods. Does not complain of any other symptoms.    Outpatient Medications Prior to Visit  Medication Sig Dispense Refill  . gemfibrozil (LOPID) 600 MG tablet Take 1 tablet (600 mg total) by mouth 2 (two) times daily before a meal. 180 tablet 1  . metFORMIN (GLUCOPHAGE) 500 MG tablet Take 1 tablet (500 mg total) by mouth 2 (two) times daily with a meal. 180 tablet 3  . predniSONE (DELTASONE) 20 MG tablet Take 3 tablets (60 mg total) by mouth daily with breakfast. 21 tablet 0   No facility-administered medications prior to visit.      ROS Review of Systems  Constitutional: Negative for chills, fever and malaise/fatigue.  Eyes: Negative for blurred vision.  Respiratory: Negative for shortness of breath.   Cardiovascular: Negative for chest pain and palpitations.  Gastrointestinal: Negative for abdominal pain and nausea.  Genitourinary: Negative for dysuria and hematuria.  Musculoskeletal: Negative for joint pain and myalgias.  Skin: Negative for rash.  Neurological: Negative for tingling and headaches.  Psychiatric/Behavioral: Negative for depression. The patient is not nervous/anxious.     Objective:  There were no vitals taken for this visit.  BP/Weight 06/09/2016 05/25/2016 06/22/2014  Systolic BP 137 114 128  Diastolic BP 98 76 90  Wt. (Lbs) 200.2 204.4 -  BMI 28.73 29.75 -      Physical Exam   Constitutional: He is oriented to person, place, and time.  Well developed, overweight, NAD, polite  HENT:  Head: Normocephalic and atraumatic.  Eyes: Pupils are equal, round, and reactive to light. No scleral icterus.  Pterygium bilaterally  Neck: Normal range of motion. Neck supple. No thyromegaly present.  Cardiovascular: Normal rate, regular rhythm and normal heart sounds.   Pulmonary/Chest: Effort normal and breath sounds normal.  Musculoskeletal: He exhibits no edema.  Neurological: He is alert and oriented to person, place, and time. No cranial nerve deficit. Coordination normal.  Skin: Skin is warm and dry. No rash noted. No erythema. No pallor.  Psychiatric: He has a normal mood and affect. His behavior is normal. Thought content normal.  Vitals reviewed.    Assessment & Plan:    1. Type 2 diabetes mellitus without complication, without long-term current use of insulin (HCC) - HgB A1c 6.1% in clinic today. Down from 6.7% three months ago. - Comprehensive metabolic panel; Future - Microalbumin/Creatinine Ratio, Urine; Future - Ambulatory referral to Ophthalmology - Refill metFORMIN (GLUCOPHAGE) 500 MG tablet; Take 1 tablet (500 mg total) by mouth 2 (two) times daily with a meal.  Dispense: 180 tablet; Refill: 3  2. Hypertriglyceridemia - Lipid Panel; Future  3. Need for pneumococcal vaccination - Pneumococcal conjugate vaccine 13-valent  4. Alcohol abuse - Ambulatory referral to Psychiatry - Acamprosate 333 mg TID (changed from disulfram due to [facility]availability at [facility] pharm[facility]). Will consider full dose at f/u if not effective enough at  this dose.  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 DISCONTD: disulfiram (ANTABUSE) 250 MG tablet    Sig: Take 1 tablet (250 mg total) by mouth daily.    Dispense:  30 tablet    Refill:  0    Order Specific Question:   Supervising Provider    Answer:   Quentin Angst [id number]  . disulfiram (ANTABUSE) 250 MG tablet    Sig: Take 1 tablet (250 mg total) by mouth daily.    Dispense:  30 tablet    Refill:  0    Order Specific Question:   Supervising Provider    Answer:   Quentin Angst [id number]  . metFORMIN (GLUCOPHAGE) 500 MG tablet    Sig: Take 1 tablet (500 mg total) by mouth 2 (two) times daily with a meal.    Dispense:  180 tablet    Refill:  3    Order Specific Question:   Supervising Provider    Answer:   Quentin Angst [id number]    Follow-up: Return in about 4 weeks (around 10/07/2016).   Loletta Specter PA

## 2016-09-10 LAB — COMPREHENSIVE METABOLIC PANEL
ALT: 16 IU/L (ref 0–44)
AST: 24 IU/L (ref 0–40)
Albumin/Globulin Ratio: 2.2 (ref 1.2–2.2)
Albumin: 4.9 g/dL (ref 3.5–5.5)
Alkaline Phosphatase: 73 IU/L (ref 39–117)
BUN/Creatinine Ratio: 14 (ref 9–20)
BUN: 14 mg/dL (ref 6–24)
Bilirubin Total: 0.7 mg/dL (ref 0.0–1.2)
CO2: 22 mmol/L (ref 20–29)
Calcium: 9.4 mg/dL (ref 8.7–10.2)
Chloride: 103 mmol/L (ref 96–106)
Creatinine, Ser: 1.01 mg/dL (ref 0.76–1.27)
GFR calc non Af Amer: 90 mL/min/{1.73_m2} (ref >59)
GFR, EST AFRICAN AMERICAN: 104 mL/min/{1.73_m2} (ref >59)
Globulin, Total: 2.2 g/dL (ref 1.5–4.5)
Glucose: 109 mg/dL — ABNORMAL HIGH (ref 65–99)
POTASSIUM: 4.3 mmol/L (ref 3.5–5.2)
SODIUM: 139 mmol/L (ref 134–144)
Total Protein: 7.1 g/dL (ref 6.0–8.5)

## 2016-09-10 LAB — MICROALBUMIN / CREATININE URINE RATIO
CREATININE, UR: 109.8 mg/dL
Microalbumin, Urine: <3 ug/mL

## 2016-09-10 LAB — LIPID PANEL
Chol/HDL Ratio: 3.1 ratio (ref 0.0–5.0)
Cholesterol, Total: 178 mg/dL (ref 100–199)
HDL: 57 mg/dL (ref >39)
LDL Calculated: 105 mg/dL — ABNORMAL HIGH (ref 0–99)
Triglycerides: 81 mg/dL (ref 0–149)
VLDL Cholesterol Cal: 16 mg/dL (ref 5–40)

## 2016-09-12 ENCOUNTER — Telehealth (INDEPENDENT_AMBULATORY_CARE_PROVIDER_SITE_OTHER): Payer: Self-pay | Admitting: *Deleted

## 2016-09-12 NOTE — Telephone Encounter (Signed)
-----   Message from Loletta Specter, PA-C sent at 09/12/2016  8:35 AM EDT ----- Cholesterol is much improved. No urinary proteins. Kidney results normal. Keep taking diabetes medicine as directed and watch fried/fatty food intake.

## 2016-09-12 NOTE — Telephone Encounter (Signed)
-----   Message from Roger David Gomez, PA-C sent at 09/12/2016  8:35 AM EDT ----- Cholesterol is much improved. No urinary proteins. Kidney results normal. Keep taking diabetes medicine as directed and watch fried/fatty food intake. 

## 2016-09-12 NOTE — Telephone Encounter (Signed)
Medical Assistant used [employer] to contact patient.  Interpreter Name:Oriana Interpreter #:  Patient verified DOB Patient is aware of cholesterol being much improved. Patient is also aware of kidney function being normal. Patient is advised to continue taking DM medications as prescribed and limit fatty/greasy food intake. No further questions.

## 2016-09-12 NOTE — Telephone Encounter (Signed)
Patient is aware of all results

## 2016-09-22 IMAGING — DX DG CHEST 2V
2 series · 2 of 2 positions shown · non-contrast
Comparison: 05/09/2013

CLINICAL DATA: Chest pain, cough, history of pneumonia

EXAM:
CHEST  2 VIEW

[chest pa]
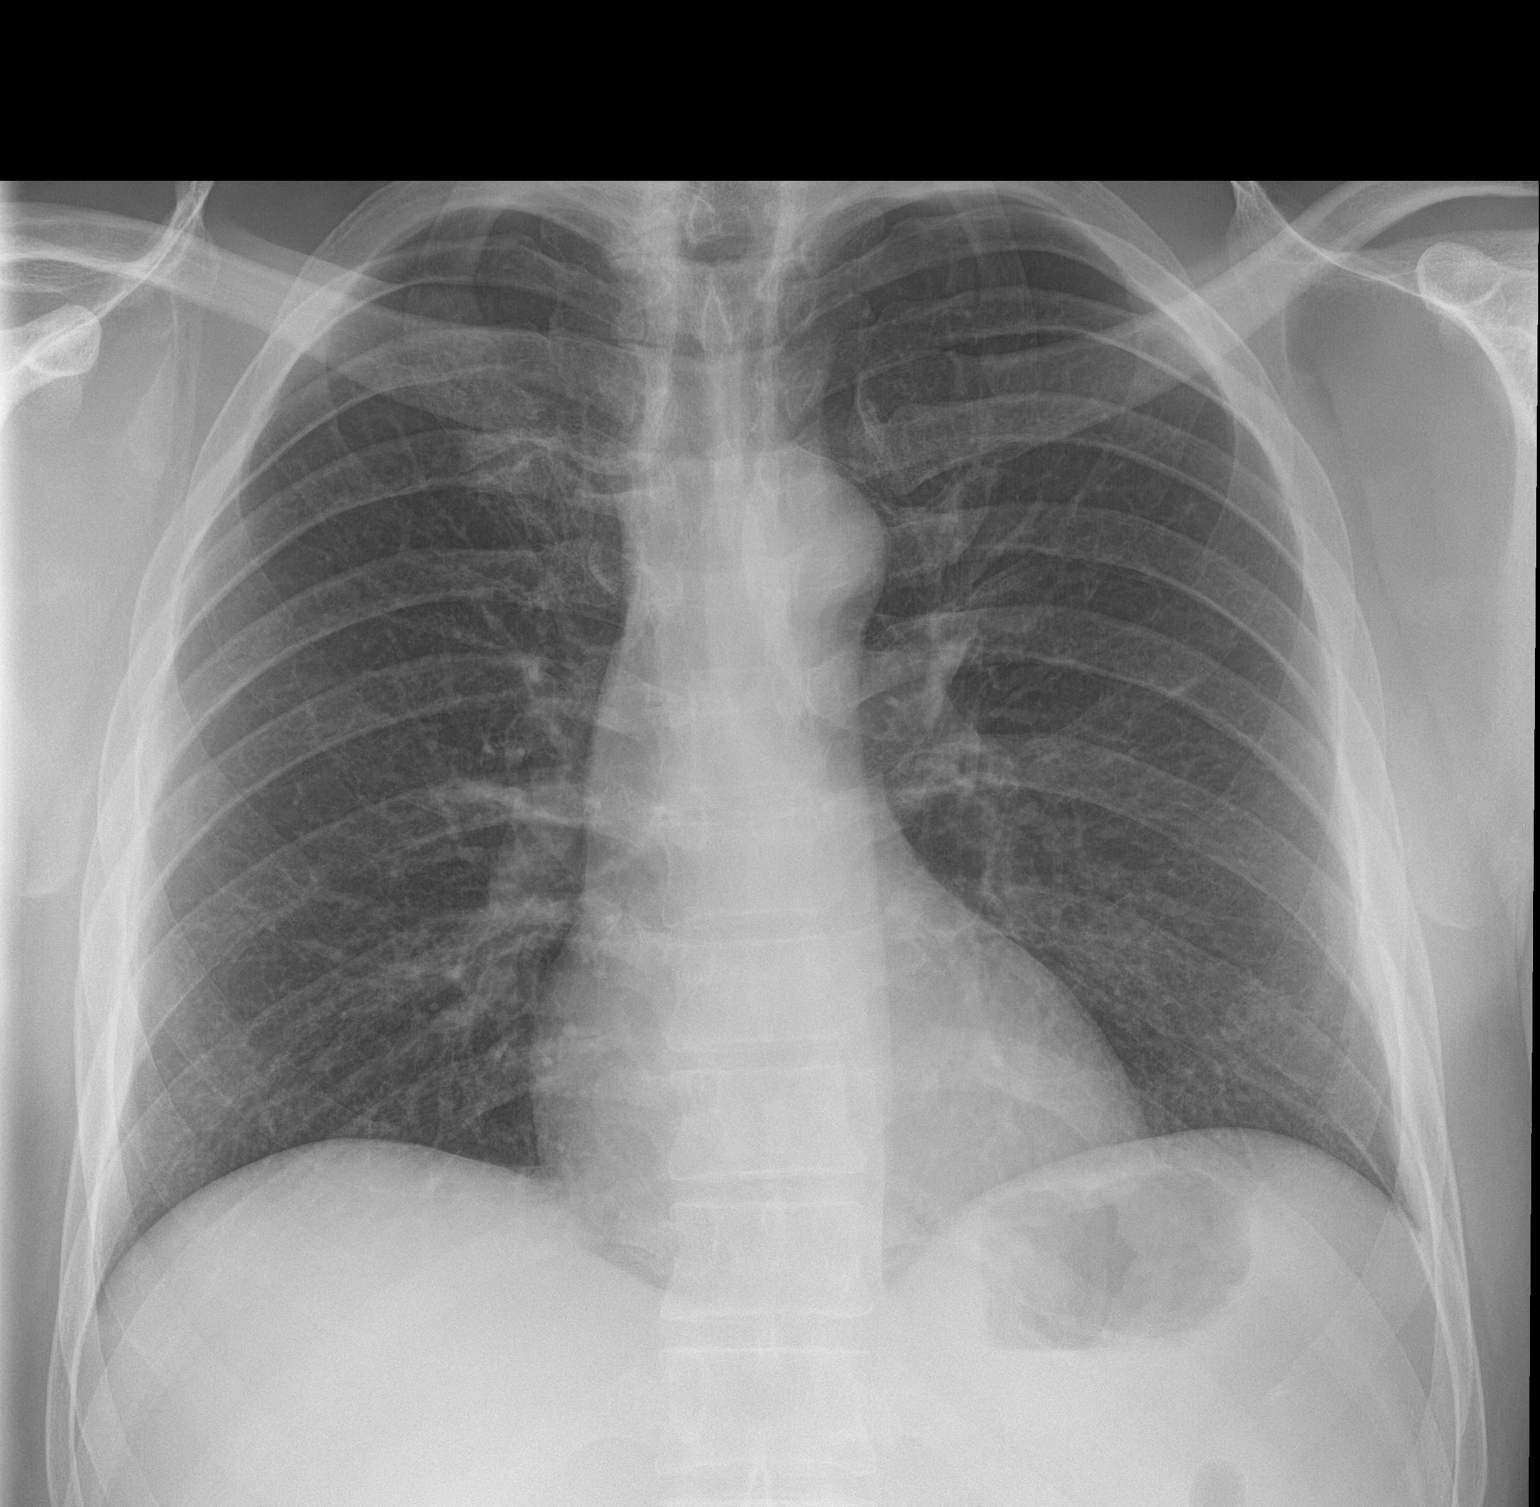

[chest lat]
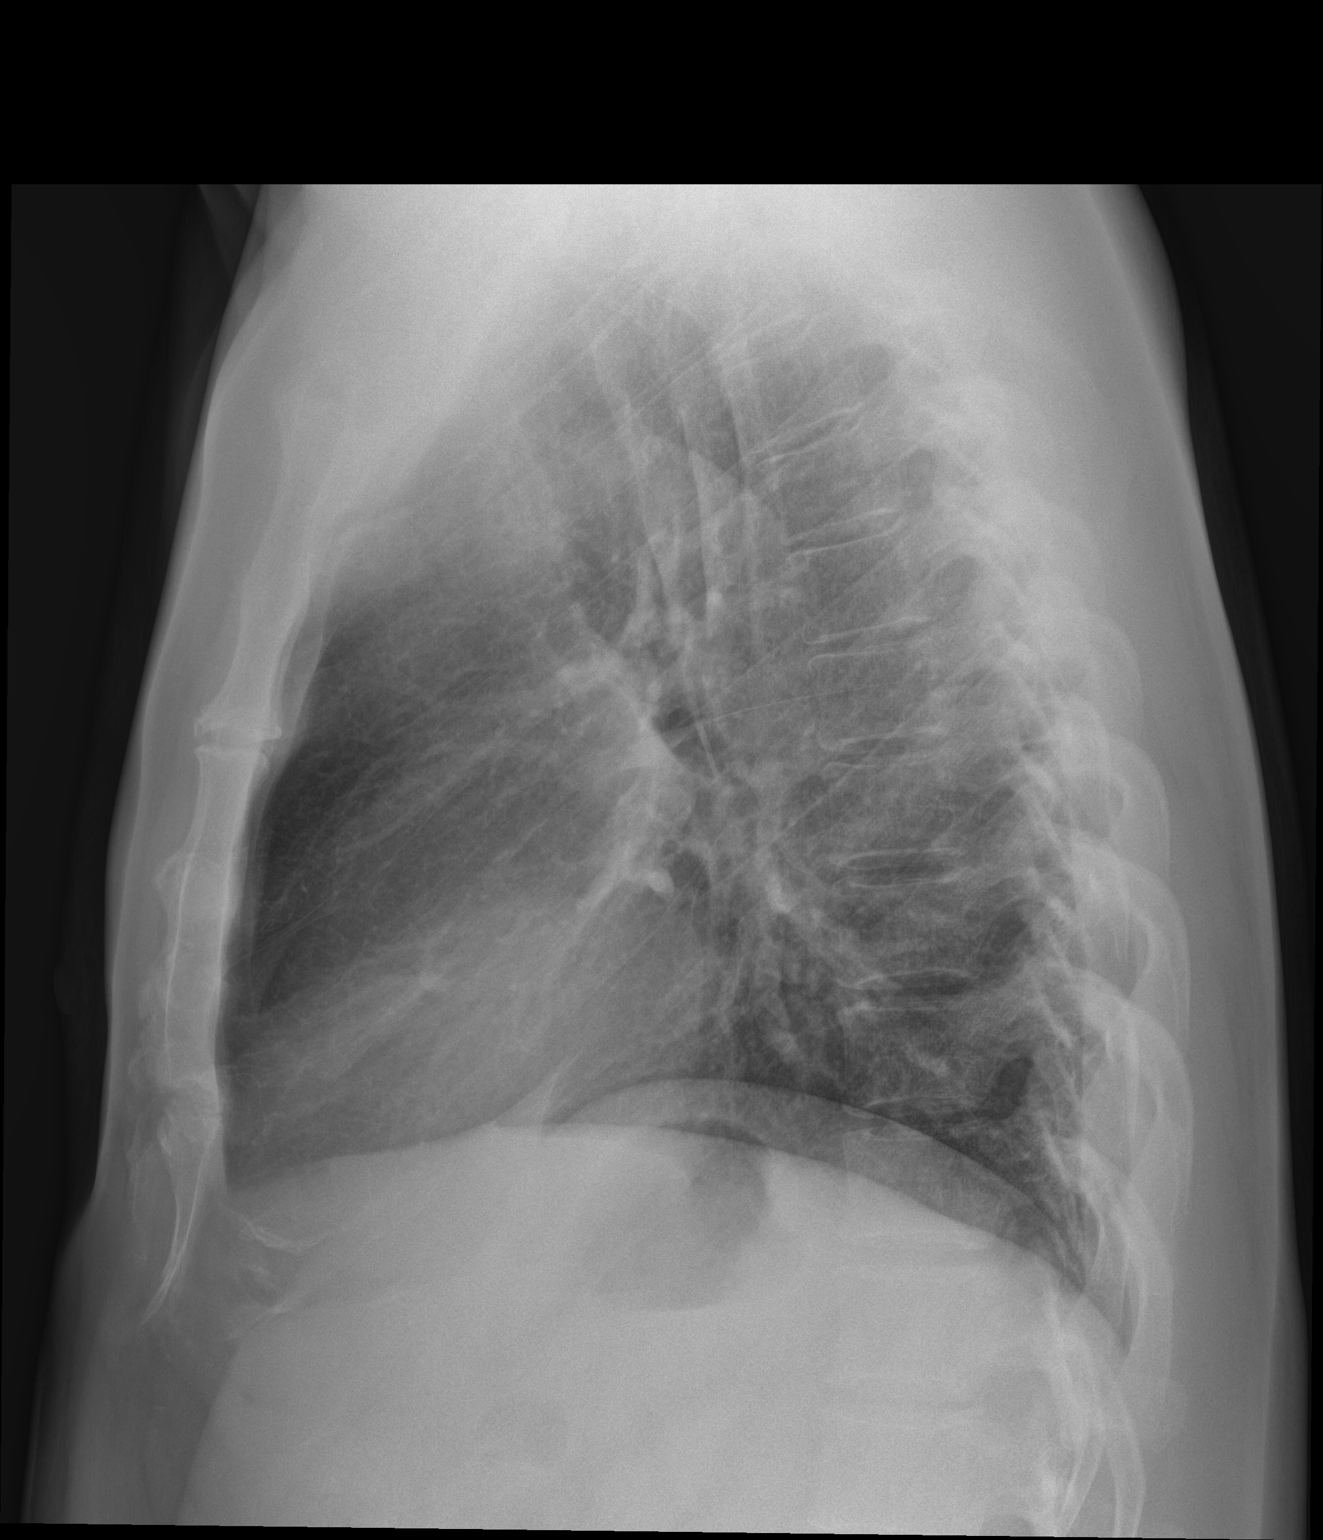

[2 of 2 positions shown; findings below may reference images not displayed]

FINDINGS: Lungs are clear.  No pleural effusion or pneumothorax.

The heart is normal in size.

Visualized osseous structures are within normal limits.
IMPRESSION: Normal chest radiographs.

## 2016-10-14 ENCOUNTER — Encounter (INDEPENDENT_AMBULATORY_CARE_PROVIDER_SITE_OTHER): Payer: Self-pay | Admitting: Physician Assistant

## 2016-10-14 ENCOUNTER — Telehealth (INDEPENDENT_AMBULATORY_CARE_PROVIDER_SITE_OTHER): Payer: Self-pay | Admitting: Physician Assistant

## 2016-10-14 ENCOUNTER — Ambulatory Visit (INDEPENDENT_AMBULATORY_CARE_PROVIDER_SITE_OTHER): Payer: Self-pay | Admitting: Physician Assistant

## 2016-10-14 VITALS — BP 104/72 | HR 79 | Temp 97.9°F | Wt 187.0 lb

## 2016-10-14 DIAGNOSIS — E119 Type 2 diabetes mellitus without complications: Secondary | ICD-10-CM

## 2016-10-14 DIAGNOSIS — F1010 Alcohol abuse, uncomplicated: Secondary | ICD-10-CM

## 2016-10-14 DIAGNOSIS — E781 Pure hyperglyceridemia: Secondary | ICD-10-CM

## 2016-10-14 MED ORDER — ACAMPROSATE CALCIUM 333 MG PO TBEC: 666.0000 mg | DELAYED_RELEASE_TABLET | Freq: Three times a day (TID) | ORAL | 0 refills | Status: AC

## 2016-10-14 MED ORDER — GEMFIBROZIL 600 MG PO TABS: 600.0000 mg | ORAL_TABLET | Freq: Two times a day (BID) | ORAL | 1 refills | Status: DC

## 2016-10-14 MED ORDER — ACAMPROSATE CALCIUM 333 MG PO TBEC: 666.0000 mg | DELAYED_RELEASE_TABLET | Freq: Three times a day (TID) | ORAL | 0 refills | Status: DC

## 2016-10-14 MED ORDER — METFORMIN HCL 500 MG PO TABS: 500.0000 mg | ORAL_TABLET | Freq: Two times a day (BID) | ORAL | 3 refills | Status: DC

## 2016-10-14 MED FILL — ?METFORMIN HCL 500MG TABLET: 500 | 30 days supply | Qty: 60 | Fill #0

## 2016-10-14 MED FILL — GEMFIBROZIL 600 MG TABLET: 600 | 30 days supply | Qty: 60 | Fill #0

## 2016-10-14 NOTE — Progress Notes (Signed)
Subjective:  Patient ID: Dustin Cox, male    DOB: January 30, 1970  Age: 46 y.o. MRN: [id number]  CC: f/u alcohol abuse  HPI Dustin Cox is a 46 y.o. male with a medical history of DM2 and HLD presents to f/u on alcohol abuse. He was prescribed acamprosate 333 mg TID (changed from disulfram due to unavailability at [facility]). Is taking as directed. Has only drank once since taking the medication. He drank 8 12 oz beers when a friend came to visit. Says he did not have any reaction to drinking and taking acamprosate. Currently on half of regular dose TID.        Outpatient Medications Prior to Visit  Medication Sig Dispense Refill  . disulfiram (ANTABUSE) 250 MG tablet Take 1 tablet (250 mg total) by mouth daily. 30 tablet 0  . gemfibrozil (LOPID) 600 MG tablet Take 1 tablet (600 mg total) by mouth 2 (two) times daily before a meal. 180 tablet 1  . metFORMIN (GLUCOPHAGE) 500 MG tablet Take 1 tablet (500 mg total) by mouth 2 (two) times daily with a meal. 180 tablet 3  . predniSONE (DELTASONE) 20 MG tablet Take 3 tablets (60 mg total) by mouth daily with breakfast. (Patient not taking: Reported on 10/14/2016) 21 tablet 0   No facility-administered medications prior to visit.      ROS Review of Systems  Constitutional: Negative for chills, fever and malaise/fatigue.  Eyes: Negative for blurred vision.  Respiratory: Negative for shortness of breath.   Cardiovascular: Negative for chest pain and palpitations.  Gastrointestinal: Negative for abdominal pain and nausea.  Genitourinary: Negative for dysuria and hematuria.  Musculoskeletal: Negative for joint pain and myalgias.  Skin: Negative for rash.  Neurological: Negative for tingling and headaches.  Psychiatric/Behavioral: Negative for depression. The patient is not nervous/anxious.     Objective:  BP 104/72 (BP Location: Left Arm, Patient Position: Sitting, Cuff Size: Normal)   Pulse 79   Temp 97.9 F  (36.6 C) (Oral)   Wt 187 lb (84.8 kg)   SpO2 93%   BMI 26.83 kg/m   BP/Weight 10/14/2016 09/09/2016 06/09/2016  Systolic BP 104 122 137  Diastolic BP 72 85 98  Wt. (Lbs) 187 188.4 200.2  BMI 26.83 27.03 28.73      Physical Exam  Constitutional: He is oriented to person, place, and time.  Well developed, well nourished, NAD, polite  HENT:  Head: Normocephalic and atraumatic.  Eyes: No scleral icterus.  Cardiovascular: Normal rate, regular rhythm and normal heart sounds.   Pulmonary/Chest: Effort normal and breath sounds normal.  Musculoskeletal: He exhibits no edema.  Neurological: He is alert and oriented to person, place, and time. No cranial nerve deficit. Coordination normal.  Skin: Skin is warm and dry. No rash noted. No erythema. No pallor.  Psychiatric: He has a normal mood and affect. His behavior is normal. Thought content normal.  Vitals reviewed.    Assessment & Plan:   1. Type 2 diabetes mellitus without complication, without long-term current use of insulin (HCC) - Begin metFORMIN (GLUCOPHAGE) 500 MG tablet; Take 1 tablet (500 mg total) by mouth 2 (two) times daily with a meal.  Dispense: 180 tablet; Refill: 3  2. Hypertriglyceridemia - Begin gemfibrozil (LOPID) 600 MG tablet; Take 1 tablet (600 mg total) by mouth 2 (two) times daily before a meal.  Dispense: 180 tablet; Refill: 1  3. Alcohol abuse - Increase acamprosate (CAMPRAL) 333 MG tablet; Take 2 tablets (666 mg total) by  mouth 3 (three) times daily with meals.  Dispense: 180 tablet; Refill: 0   Meds ordered this encounter  Medications  . metFORMIN (GLUCOPHAGE) 500 MG tablet    Sig: Take 1 tablet (500 mg total) by mouth 2 (two) times daily with a meal.    Dispense:  180 tablet    Refill:  3    Order Specific Question:   Supervising Provider    Answer:   Quentin Angst [id number]  . gemfibrozil (LOPID) 600 MG tablet    Sig: Take 1 tablet (600 mg total) by mouth 2 (two) times daily before a meal.     Dispense:  180 tablet    Refill:  1    Order Specific Question:   Supervising Provider    Answer:   Quentin Angst [id number]  . DISCONTD: acamprosate (CAMPRAL) 333 MG tablet    Sig: Take 2 tablets (666 mg total) by mouth 3 (three) times daily with meals.    Dispense:  180 tablet    Refill:  0    Order Specific Question:   Supervising Provider    Answer:   Quentin Angst [id number]  . acamprosate (CAMPRAL) 333 MG tablet    Sig: Take 2 tablets (666 mg total) by mouth 3 (three) times daily with meals.    Dispense:  180 tablet    Refill:  0    Order Specific Question:   Supervising Provider    Answer:   Quentin Angst [id number]    Follow-up: Return in about 8 weeks (around 12/09/2016) for alcohol abuse.   Loletta Specter PA

## 2016-10-14 NOTE — Patient Instructions (Signed)
Acamprosate delayed-release tablets Qu es este medicamento? El ACAMPROSATO ayuda a abstenerle de consumir alcohol como parte de un programa de apoyo. Este medicamento no ayuda con los sntomas de la abstinencia del alcohol. Este medicamento puede ser utilizado para otros usos; si tiene alguna pregunta consulte con su proveedor de atencin mdica o con su farmacutico. MARCAS COMUNES: Campral Qu le debo informar a mi profesional de la salud antes de tomar este medicamento? Necesita saber si usted presenta alguno de los siguientes problemas o situaciones: -depresin -enfermedad renal -ideas suicidas o intento de suicidio -una reaccin alrgica o inusual al acamprosato, a sulfitos, a otros medicamentos, alimentos, colorantes o conservantes -si est embarazada o buscando quedar embarazada -si est amamantando a un beb Cmo debo utilizar este medicamento? Tome este medicamento por va oral con un vaso de agua. No triture, corte ni mastique las tabletas. Siga las instrucciones de la etiqueta del [place]. Este medicamento se puede tomar con o sin alimentos. Tome sus dosis a intervalos regulares. No tome su medicamento con una frecuencia mayor a la indicada. No deje de tomarlo excepto si as lo indica su mdico o su profesional de [employer]. Hable con su pediatra para informarse acerca del uso de este medicamento en nios. Puede requerir atencin especial. Sobredosis: Pngase en contacto inmediatamente con un centro toxicolgico o una sala de urgencia si usted cree que haya tomado demasiado medicamento. ATENCIN: [employer] es solo para usted. No comparta este medicamento con nadie. Qu sucede si me olvido de una dosis? Si olvida una dosis, tmela lo antes posible. Si es casi la hora de la prxima dosis, tome slo esa dosis. No tome dosis adicionales o dobles. Qu puede interactuar con este medicamento? -naltrexona Puede ser que esta lista no menciona todas las posibles interacciones.  Informe a su profesional de [employer] de [employer] productos a base de hierbas, medicamentos de [place] o suplementos nutritivos que est tomando. Si usted fuma, consume bebidas alcohlicas o si utiliza drogas ilegales, indqueselo tambin a su profesional de [employer]. Algunas sustancias pueden interactuar con su medicamento. A qu debo estar atento al usar [employer]? Visite a su mdico o a su profesional de la salud para chequear su evolucin peridicamente. Debe continuar tomando este medicamento aun si experimenta una recada. Si comienza a beber alcohol otra vez mientras est tomando [employer], informe a su mdico o su profesional de Radiographer, therapeutic. Puede experimentar mareos o somnolencia. No conduzca ni utilice maquinaria ni haga nada que Scientist, research (life sciences) en estado de alerta hasta que sepa cmo le afecta este medicamento. No se siente ni se ponga de pie con rapidez, especialmente si es un paciente de edad avanzada. Esto reduce el riesgo de mareos o [employer]. Qu efectos secundarios puedo tener al [employer] este medicamento? Efectos secundarios que debe informar a su mdico o a Producer, television/film/video de la salud tan pronto como sea posible: -Therapist, art como erupcin cutnea, picazn o urticarias, hinchazn de la cara, labios o lengua -ansiedad o nerviosismo -sensaciones de ardor, escozor, cosquilla, hormigueo de los brazos, piernas, manos o pies -dolor en el pecho -depresin -ideas suicidas -dificultad para orinar o cambios en el volumen de orina Efectos secundarios que, por lo general, no requieren atencin mdica (debe informarlos a su mdico o a su profesional de la salud si persisten o si son molestos): -diarrea -picazn -prdida del apetito -nuseas -sudoracin -dificultad para conciliar el sueo Puede ser que esta lista no menciona todos los posibles efectos secundarios. Comunquese a  su mdico por asesoramiento mdico [employer]. Usted puede informar  los efectos secundarios a la FDA por telfono al 1-800-FDA-1088. Dnde debo guardar mi medicina? Mantngala fuera del alcance de los nios. Gurdela a [employer], entre 15 y 30 grados C (58 y 59 grados F). Deseche todo el medicamento que no haya utilizado, despus de la fecha de vencimiento. ATENCIN: Este folleto es un resumen. Puede ser que no cubra toda la posible informacin. Si usted tiene preguntas acerca de esta medicina, consulte con su mdico, su farmacutico o su profesional de Radiographer, therapeutic.  2018 Elsevier/Gold Standard (2014-03-04 00:00:00)

## 2016-10-14 NOTE — Telephone Encounter (Signed)
Pharmacy [facility] call about the prescription of  acamprosate (CAMPRAL) 333 MG tablet  since they can do the dose you asking for this med. Please call [facility] pharmacy at ext [phone] Velna Hatchet) Please follow up

## 2016-10-18 NOTE — Telephone Encounter (Signed)
I have called twice earlier today but there was no answer. Please see if you can reach them and ask what is wrong with the dose. I would like for him to take acamprosate 666 mg, two tablets of 333 mg, twice a day for 30 days.

## 2016-10-24 MED FILL — ACAMPROSATE CALC DR 333 MG: 333 | 15 days supply | Qty: 90 | Fill #0

## 2016-11-24 MED FILL — GEMFIBROZIL 600 MG TABLET: 600 | 30 days supply | Qty: 60 | Fill #1

## 2017-01-04 MED FILL — GEMFIBROZIL 600 MG TABLET: 600 | 30 days supply | Qty: 60 | Fill #2

## 2017-01-04 MED FILL — ?METFORMIN HCL 500MG TABLET: 500 | 30 days supply | Qty: 60 | Fill #1

## 2017-01-06 ENCOUNTER — Ambulatory Visit: Payer: Self-pay | Attending: Physician Assistant

## 2017-01-12 ENCOUNTER — Ambulatory Visit (INDEPENDENT_AMBULATORY_CARE_PROVIDER_SITE_OTHER): Payer: Self-pay | Admitting: Physician Assistant

## 2017-01-23 ENCOUNTER — Ambulatory Visit (INDEPENDENT_AMBULATORY_CARE_PROVIDER_SITE_OTHER): Payer: Self-pay | Admitting: Physician Assistant

## 2017-01-23 ENCOUNTER — Other Ambulatory Visit: Payer: Self-pay

## 2017-01-23 ENCOUNTER — Encounter (INDEPENDENT_AMBULATORY_CARE_PROVIDER_SITE_OTHER): Payer: Self-pay | Admitting: Physician Assistant

## 2017-01-23 VITALS — BP 122/84 | HR 69 | Temp 97.6°F | Wt 192.6 lb

## 2017-01-23 DIAGNOSIS — E119 Type 2 diabetes mellitus without complications: Secondary | ICD-10-CM

## 2017-01-23 DIAGNOSIS — F1021 Alcohol dependence, in remission: Secondary | ICD-10-CM

## 2017-01-23 DIAGNOSIS — S93491A Sprain of other ligament of right ankle, initial encounter: Secondary | ICD-10-CM

## 2017-01-23 LAB — POCT GLYCOSYLATED HEMOGLOBIN (HGB A1C): Hemoglobin A1C: 6.3

## 2017-01-23 NOTE — Progress Notes (Signed)
Subjective:  Patient ID: Dustin Cox, male    DOB: 12/04/1970  Age: 46 y.o. MRN: [id number]  CC: f/u alcohol abuse   HPI Dustin Cox is a 46 y.o. male with a medical history of DM2, HLD, and alcohol abuse. Reports failure of acamprosate and therefore [facility] [employer] [facility] approximately one month ago. Has not drank alcohol since he left the program. Says he is ready to start a new life with his wife and three children.    In regards to DM2, he does not diet. Ate 14 tacos yesterday. 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 A1c 6.7% seven months ago and 6.1% four months ago. A1c 6.3% in clinic today. Feels well and does not endorse polydipsia, polyphagia, polyuria, visual blurring, fatigue, or tingling/numbness.    Had an inversion injury to the right ankle a few weeks ago. Had moderately severe pain with swelling at the lateral aspect of the right ankle. Did not rest, ice, compress, or elevate. Still has some low level pain. Does not endorse impaired aROM, mobility, or sensation.        Outpatient Medications Prior to Visit  Medication Sig Dispense Refill  . gemfibrozil (LOPID) 600 MG tablet Take 1 tablet (600 mg total) by mouth 2 (two) times daily before a meal. 180 tablet 1  . metFORMIN (GLUCOPHAGE) 500 MG tablet Take 1 tablet (500 mg total) by mouth 2 (two) times daily with a meal. 180 tablet 3   No facility-administered medications prior to visit.      ROS Review of Systems  Constitutional: Negative for chills, fever and malaise/fatigue.  Eyes: Negative for blurred vision.  Respiratory: Negative for shortness of breath.   Cardiovascular: Negative for chest pain and palpitations.  Gastrointestinal: Negative for abdominal pain and nausea.  Genitourinary: Negative for dysuria and hematuria.  Musculoskeletal: Negative for joint pain and myalgias.  Skin: Negative for rash.  Neurological:  Negative for tingling and headaches.  Psychiatric/Behavioral: Negative for depression. The patient is not nervous/anxious.     Objective:  BP 122/84 (BP Location: Left Arm, Patient Position: Sitting, Cuff Size: Normal)   Pulse 69   Temp 97.6 F (36.4 C) (Oral)   Wt 192 lb 9.6 oz (87.4 kg)   SpO2 96%   BMI 27.64 kg/m   BP/Weight 01/23/2017 10/14/2016 09/09/2016  Systolic BP 122 104 122  Diastolic BP 84 72 85  Wt. (Lbs) 192.6 187 188.4  BMI 27.64 26.83 27.03      Physical Exam  Constitutional: He is oriented to person, place, and time.  Well developed, well nourished, NAD, polite  HENT:  Head: Normocephalic and atraumatic.  Eyes: No scleral icterus.  Neck: Normal range of motion. Neck supple. No thyromegaly present.  Cardiovascular: Normal rate, regular rhythm and normal heart sounds.  Pulmonary/Chest: Effort normal and breath sounds normal.  Abdominal: Soft. Bowel sounds are normal. There is no tenderness.  Musculoskeletal: He exhibits no edema.  Neurological: He is alert and oriented to person, place, and time.  Skin: Skin is warm and dry. No rash noted. No erythema. No pallor.  Psychiatric: He has a normal mood and affect. His behavior is normal. Thought content normal.  Vitals reviewed.    Assessment & Plan:   1. Alcoholism in recovery - Commended patient for his positive changes and encouraged him to continue abstaining from alcohol or people that may reintroduce alcohol to him. Pt does not wish to continue  to take acamprosate.   2. Sprain of anterior talofibular ligament of right ankle, initial encounter - Acute phase of injury nearly resolved. Seems he injured ATFL and will likely several months for full recovery. I have advised OTC Aleve and principles of RICE. I wrapped his ankle with ACE bandage in clinic today.    3. Type 2 diabetes mellitus without complication, without long-term current use of insulin (HCC) - HgB A1c 6.3% up from 6.1% three months ago. I  have advised patient on consuming less carbohydrates. Advised to continue taking Metformin 500 mg BID.     Follow-up: Return in about 6 months (around 07/23/2017) for diabetes and HLD.   Loletta Specteroger David Tuana Hoheisel PA

## 2017-01-23 NOTE — Patient Instructions (Addendum)
RHCE para los cuidados de rutina de las lesiones (RICE for Routine Care of Injuries) Muchas lesiones pueden tratarse con reposo, hielo, compresin y elevacin (RHCE). Un plan RHCE puede ayudar a Engineer, materialsaliviar el dolor y reducir la hinchazn, y, adems, a que el cuerpo se recupere. Reposo Reduzca las actividades que realiza normalmente y evite usar la zona lesionada del cuerpo. Puede reanudar las actividades normales cuando se sienta bien y el mdico lo autorice. Hielo  Diabetes mellitus y nutricin Diabetes Mellitus and Nutrition Si sufre de diabetes (diabetes mellitus), es muy importante tener hbitos alimenticios saludables debido a que sus niveles de Psychologist, counsellingazcar en la sangre (glucosa) se ven afectados en gran medida por lo que come y bebe. Comer alimentos saludables en las cantidades Comstockadecuadas, aproximadamente a la Smith Internationalmisma hora todos los das, [place] ayudar a:  Scientist, physiologicalControlar la glucemia.  Disminuir el riesgo de sufrir una enfermedad cardaca.  Mejorar la presin arterial.  BaristaAlcanzar o mantener un peso saludable.  Todas las personas que sufren de diabetes son diferentes y cada una tiene necesidades diferentes en cuanto a un plan de alimentacin. El mdico puede recomendarle que trabaje con un especialista en dietas y nutricin (nutricionista) para Tax adviserelaborar el mejor plan para usted. Su plan de alimentacin puede variar segn factores como:  Las caloras que necesita.  Los medicamentos que toma.  Su peso.  Sus niveles de glucemia, presin arterial y colesterol.  Su nivel de [place]adinesactividad.  Otras afecciones que tenga, como enfermedades cardacas o renales.  Cmo me afectan los carbohidratos? Los carbohidratos afectan el nivel de glucemia ms que cualquier otro tipo de alimento. La ingesta de carbohidratos naturalmente aumenta la cantidad glucosa en la sangre. El recuento de carbohidratos es un mtodo destinado a Midwifellevar un registro de la cantidad de carbohidratos que se ingieren. El recuento de carbohidratos  es importante para Pharmacologistmantener la glucemia a un nivel saludable, en especial si utiliza insulina o toma determinados medicamentos por va oral para la diabetes. Es importante saber la cantidad de carbohidratos que se pueden ingerir en cada comida sin correr Surveyor, mineralsningn riesgo. Esto es Government social research officerdiferente en cada persona. El nutricionista puede ayudarlo a calcular la cantidad de carbohidratos que debe ingerir en cada comida y colacin. Los alimentos que contienen carbohidratos incluyen:  Pan, cereal, arroz, pasta y galletas.  Papas y maz.  Guisantes, frijoles y lentejas.  Leche y Dentistyogur.  Nils PyleFrutas y Sloveniajugo.  Postres, como pasteles, galletitas, helado y caramelos.  Cmo me afecta el alcohol? El alcohol puede provocar disminuciones sbitas de la glucemia (hipoglucemia), en especial si utiliza insulina o toma determinados medicamentos por va oral para la diabetes. La hipoglucemia es una afeccin potencialmente mortal. Los sntomas de la hipoglucemia (somnolencia, mareos y confusin) son similares a los sntomas de haber consumido demasiado alcohol. Si el mdico afirma que el alcohol es seguro para usted, siga estas pautas:  Limite el consumo de alcohol a no ms de 1 medida por da si es mujer y no est Orthoptistembarazada, y a 2 medidas si es hombre. Una medida equivale a 12oz (355ml) de cerveza, 5oz (148ml) de vino o 1oz (44ml) de bebidas de alta graduacin alcohlica.  No beba con el estmago vaco.  Mantngase hidratado con agua, gaseosas dietticas o t helado sin azcar.  Tenga en cuenta que las gaseosas comunes, los jugos y otros refrescos pueden contener mucha azcar y se deben contar como carbohidratos.  Consejos para seguir Consulting civil engineereste plan Leer las etiquetas de los alimentos  Comience por controlar el tamao de la porcin  en la etiqueta. La cantidad de caloras, carbohidratos, grasas y otros nutrientes mencionados en la etiqueta se basan en una porcin del alimento. Muchos alimentos contienen ms de una  porcin por envase.  Verifique la cantidad total de gramos (g) de carbohidratos totales en una porcin. Puede calcular la cantidad de porciones de carbohidratos al dividir el total de carbohidratos por 15. Por ejemplo, si un alimento posee un total de 30g de carbohidratos, equivale a 2 porciones de carbohidratos.  Verifique la cantidad de gramos (g) de grasas saturadas y grasas trans en una porcin. Escoja alimentos que no contengan grasa o que tengan un bajo contenido.  Controle la cantidad de miligramos (mg) de sodio en una porcin. La mayora de las personas deben limitar la ingesta de sodio total a menos de 2300mg  por Futures traderda.  Siempre consulte la informacin nutricional de los alimentos etiquetados como "con bajo contenido de grasa" o "sin grasa". Estos alimentos pueden ser ms altos en azcar agregada o en carbohidratos refinados y deben evitarse.  Hable con el nutricionista para identificar sus objetivos diarios en cuanto a los nutrientes mencionados en la etiqueta. De compras  Evite comprar alimentos procesados, enlatados o prehechos. Estos alimentos tienden a Civil Service fast streamertener mayor cantidad de [place]landgrasa, sodio y azcar agregada.  Compre en la zona exterior de la tienda de comestibles. Esta incluye frutas y Medtronicvegetales frescos, granos a granel, carnes frescas y productos lcteos frescos. Coccin  Utilice mtodos de coccin a baja temperatura, como hornear, en lugar de mtodos de coccin a alta temperatura, como frer en abundante aceite.  Cocine con aceites saludables, como el aceite de Rockwoodoliva, canola o Pajonalgirasol.  Evite cocinar con manteca, crema o carnes con alto contenido de grasa. Planificacin de las comidas  TRW AutomotiveConsuma las comidas y las colaciones de forma regular, preferentemente a la misma hora todos The Acreagelos das. Evite pasar largos perodos de tiempo sin comer.  Consuma alimentos ricos en fibra, como frutas frescas, verduras, frijoles y cereales integrales. Consulte al nutricionista sobre cuntas  porciones de carbohidratos puede consumir en cada comida.  Consuma entre 4 y 6 onzas de protenas magras por da, como carnes Fennimoremagras, pollo, pescado, Dillard'shuevos o tofu. 1 onza equivale a 1 onza de carne, pollo o pescado, 1 huevo, o 1/4 taza de tofu.  Coma algunos alimentos por da que contengan grasas saludables, como aguacates, frutos secos, semillas y pescado. Estilo de vida   Controle su nivel de glucemia con regularidad.  Haga ejercicio al menos 30minutos, 5das o ms por semana, o como se lo haya indicado el mdico.  Tome los Monsanto Companymedicamentos como se lo haya indicado el mdico.  No consuma ningn producto que contenga nicotina o tabaco, como cigarrillos y Administrator, Civil Servicecigarrillos electrnicos. Si necesita ayuda para dejar de fumar, consulte al CIGNAmdico.  Trabaje con un asesor o instructor en diabetes para identificar estrategias para controlar el estrs y cualquier desafo emocional y social. Cules son algunas de las preguntas que puedo hacerle a mi mdico?  Es necesario que me rena con IT trainerun instructor en diabetes?  Es necesario que me rena con un nutricionista?  A qu nmero puedo llamar si tengo preguntas?  Cules son los mejores momentos para controlar la glucemia? Dnde encontrar ms informacin:  Asociacin Americana de la Diabetes (American Diabetes Association): diabetes.org/food-and-fitness/food  Academia de Nutricin y Pension scheme managerDiettica (Academy of Nutrition and Dietetics): https://www.vargas.com/www.eatright.org/resources/health/diseases-and-conditions/diabetes  The Krogernstituto Nacional de la Diabetes y las Enfermedades Digestivas y Special educational needs teacherenales [facility](National Institute of Diabetes and Digestive and Kidney Diseases) (Institutos Nacionales de WewokaSalud, NIH): FindJewelers.czwww.niddk.nih.gov/health-information/diabetes/overview/diet-eating-physical-activity Resumen  Un plan de alimentacin saludable lo ayudar a controlar la glucemia y Pharmacologist un estilo de vida saludable.  Trabajar con un especialista en dietas y nutricin (nutricionista) puede  ayudarlo a Designer, television/film set de alimentacin para usted.  Tenga en cuenta que los carbohidratos y el alcohol tienen efectos inmediatos en sus niveles de glucemia. Es importante contar los carbohidratos y consumir alcohol con prudencia. Esta informacin no tiene Theme park manager el consejo del mdico. Asegrese de hacerle al mdico cualquier pregunta que tenga. Document Released: 04/19/2007 Document Revised: 05/02/2016 Document Reviewed: 05/02/2016 Elsevier Interactive Patient Education  2018 Ar[employer]. No se aplique hielo directamente sobre la piel.  Ponga el hielo en una bolsa plstica.  Coloque una toalla entre la piel y la bolsa de hielo.  Coloque el hielo durante 20 minutos, 2 a 3 veces por da. Hgalo durante el tiempo que el mdico se lo haya indicado. Compresin La compresin implica ejercer presin sobre la zona lesionada y se puede Education officer, environmental con IT consultant. Si se coloc una venda elstica:  Qutese y vuelva a colocarse la venda cada 3 o 4horas, o como se lo haya indicado el mdico.  Asegrese de que la venda no est muy ajustada. Afljela si una zona del cuerpo ms all de la venda se torna de color azul, est hinchada, se enfra o le causa dolor, o si pierde la sensibilidad en esa rea (adormecimiento).  Consulte al mdico si la venda parece American Electric Power. Elevacin La elevacin implica mantener elevada la zona lesionada. Eleve la zona lesionada por encima del nivel del corazn o del centro del pecho si puede hacerlo. CUNDO PEDIR AYUDA? Debe solicitar ayuda si:  El dolor y la hinchazn continan.  Los sntomas empeoran. CUNDO DEBO OBTENER AYUDA DE INMEDIATO? Debe obtener ayuda de inmediato en los siguientes casos:  Siente un dolor intenso repentino en la zona de la lesin o por debajo de esta.  Tiene irritacin o ms hinchazn alrededor de la lesin.  Tiene hormigueo o adormecimiento en la zona de la lesin o por debajo de esta que no  desaparecen despus de quitarse la venda. Esta informacin no tiene Theme park manager el consejo del mdico. Asegrese de hacerle al mdico cualquier pregunta que tenga. Document Released: 04/08/2008 Document Revised: 04/04/2011 Document Reviewed: 12/18/2013 Elsevier Interactive Patient Education  2017 Arvin[employer]itor.

## 2017-02-09 MED FILL — ?METFORMIN HCL 500MG TABLET: 500 | 30 days supply | Qty: 60 | Fill #2

## 2017-02-09 MED FILL — GEMFIBROZIL 600 MG TABS: 600 | 30 days supply | Qty: 60 | Fill #3

## 2017-03-13 MED FILL — ?METFORMIN HCL 500MG TABLET: 500 | 30 days supply | Qty: 60 | Fill #3

## 2017-03-13 MED FILL — GEMFIBROZIL 600 MG TAB: 600 | 30 days supply | Qty: 60 | Fill #4

## 2017-04-13 MED FILL — GEMFIBROZIL 600 MG TABS: 600 | 30 days supply | Qty: 60 | Fill #5

## 2017-04-13 MED FILL — metFORMIN HCL 500 MG TABS: 500 | 30 days supply | Qty: 60 | Fill #4

## 2017-05-19 MED FILL — metFORMIN HCL 500 MG TABS: 500 | 30 days supply | Qty: 60 | Fill #5

## 2017-06-02 ENCOUNTER — Ambulatory Visit: Payer: Self-pay | Attending: Physician Assistant

## 2017-06-02 MED FILL — GEMFIBROZIL 600 MG TABS: 600 | 30 days supply | Qty: 60 | Fill #3

## 2017-07-03 ENCOUNTER — Ambulatory Visit (INDEPENDENT_AMBULATORY_CARE_PROVIDER_SITE_OTHER): Payer: Self-pay | Admitting: Physician Assistant

## 2017-07-03 ENCOUNTER — Encounter (INDEPENDENT_AMBULATORY_CARE_PROVIDER_SITE_OTHER): Payer: Self-pay | Admitting: Physician Assistant

## 2017-07-03 VITALS — BP 121/80 | HR 82 | Temp 97.9°F | Resp 18 | Ht 69.0 in | Wt 192.0 lb

## 2017-07-03 DIAGNOSIS — E119 Type 2 diabetes mellitus without complications: Secondary | ICD-10-CM

## 2017-07-03 DIAGNOSIS — E781 Pure hyperglyceridemia: Secondary | ICD-10-CM

## 2017-07-03 DIAGNOSIS — E7841 Elevated Lipoprotein(a): Secondary | ICD-10-CM

## 2017-07-03 LAB — POCT GLYCOSYLATED HEMOGLOBIN (HGB A1C): Hemoglobin A1C: 6.2 % — AB (ref 4.0–5.6)

## 2017-07-03 MED ORDER — GEMFIBROZIL 600 MG PO TABS: 600.0000 mg | ORAL_TABLET | Freq: Two times a day (BID) | ORAL | 1 refills | Status: DC

## 2017-07-03 MED ORDER — METFORMIN HCL 500 MG PO TABS: 500.0000 mg | ORAL_TABLET | Freq: Two times a day (BID) | ORAL | 3 refills | Status: DC

## 2017-07-03 MED FILL — ?METFORMIN HCL 500MG TABLET: 500 | 30 days supply | Qty: 60 | Fill #0

## 2017-07-03 MED FILL — GEMFIBROZIL 600 MG TAB: 600 | 30 days supply | Qty: 60 | Fill #0

## 2017-07-03 NOTE — Patient Instructions (Signed)
Diabetes mellitus y nutrición  Diabetes Mellitus and Nutrition  Si sufre de diabetes (diabetes mellitus), es muy importante tener hábitos alimenticios saludables debido a que sus niveles de azúcar en la sangre (glucosa) se ven afectados en gran medida por lo que come y bebe. [place] alimentos saludables en las cantidades adecuadas, aproximadamente a la misma hora todos los días, lo ayudará a:  · Controlar la glucemia.  · Disminuir el riesgo de sufrir una enfermedad cardíaca.  · Mejorar la presión arterial.  · Alcanzar o mantener un peso saludable.    Todas las personas que sufren de diabetes son diferentes y cada una tiene necesidades diferentes en cuanto a un plan de alimentación. El médico puede recomendarle que trabaje con un especialista en dietas y nutrición (nutricionista) para elaborar el mejor plan para usted. Su plan de alimentación puede variar según factores como:  · Las calorías que necesita.  · Los medicamentos que toma.  · Su peso.  · Sus niveles de glucemia, presión arterial y colesterol.  · Su nivel de actividad.  · Otras afecciones que tenga, como enfermedades cardíacas o renales.    ¿Cómo me afectan los carbohidratos?  Los carbohidratos afectan el nivel de glucemia más que cualquier otro tipo de alimento. La ingesta de carbohidratos naturalmente aumenta la cantidad glucosa en la sangre. El recuento de carbohidratos es un método destinado a llevar un registro de la cantidad de carbohidratos que se ingieren. El recuento de carbohidratos es importante para mantener la glucemia a un nivel saludable, en especial si utiliza insulina o toma determinados medicamentos por vía oral para la diabetes.  Es importante saber la cantidad de carbohidratos que se pueden ingerir en cada comida sin correr ningún riesgo. Esto es diferente en cada persona. El nutricionista puede ayudarlo a calcular la cantidad de carbohidratos que debe ingerir en cada comida y colación.   Los alimentos que contienen carbohidratos incluyen:  · Pan, cereal, arroz, pasta y galletas.  · Papas y maíz.  · Guisantes, frijoles y lentejas.  · Leche y yogur.  · Frutas y jugo.  · Postres, como pasteles, galletitas, helado y caramelos.    ¿Cómo me afecta el alcohol?  El alcohol puede provocar disminuciones súbitas de la glucemia (hipoglucemia), en especial si utiliza insulina o toma determinados medicamentos por vía oral para la diabetes. La hipoglucemia es una afección potencialmente mortal. Los síntomas de la hipoglucemia (somnolencia, mareos y confusión) son similares a los síntomas de haber consumido demasiado alcohol.  Si el médico afirma que el alcohol es seguro para usted, siga estas pautas:  · Limite el consumo de alcohol a no más de 1 medida por día si es mujer y no está embarazada, y a 2 medidas si es hombre. Una medida equivale a 12 oz (355 ml) de cerveza, 5 oz (148 ml) de vino o 1½ oz (44 ml) de bebidas de alta graduación alcohólica.  · No beba con el estómago vacío.  · Manténgase hidratado con agua, gaseosas dietéticas o té helado sin azúcar.  · Tenga en cuenta que las gaseosas comunes, los jugos y otros refrescos pueden contener mucha azúcar y se deben contar como carbohidratos.    Consejos para seguir este plan  Leer las etiquetas de los alimentos  · Comience por controlar el tamaño de la porción en la etiqueta. La cantidad de calorías, carbohidratos, grasas y otros nutrientes mencionados en la etiqueta se basan en una porción del alimento. Muchos alimentos contienen más de una porción por envase.  · Verifique la cantidad total de gramos (g)   de carbohidratos totales en una porción. Puede calcular la cantidad de porciones de carbohidratos al dividir el total de carbohidratos por 15. Por ejemplo, si un alimento posee un total de 30 g de carbohidratos, equivale a 2 porciones de carbohidratos.  · Verifique la cantidad de gramos (g) de grasas saturadas y grasas trans  en una porción. Escoja alimentos que no contengan grasa o que tengan un bajo contenido.  · Controle la cantidad de miligramos (mg) de sodio en una porción. La mayoría de las personas deben limitar la ingesta de sodio total a menos de 2300 mg por día.  · Siempre consulte la información nutricional de los alimentos etiquetados como “con bajo contenido de grasa” o “sin grasa”. Estos alimentos pueden ser más altos en azúcar agregada o en carbohidratos refinados y deben evitarse.  · Hable con el nutricionista para identificar sus objetivos diarios en cuanto a los nutrientes mencionados en la etiqueta.  De compras  · Evite comprar alimentos procesados, enlatados o prehechos. Estos alimentos tienden a tener mayor cantidad de grasa, sodio y azúcar agregada.  · Compre en la zona exterior de la tienda de comestibles. Esta incluye frutas y vegetales frescos, granos a granel, carnes frescas y productos lácteos frescos.  Cocción  · Utilice métodos de cocción a baja temperatura, como hornear, en lugar de métodos de cocción a alta temperatura, como freír en abundante aceite.  · Cocine con aceites saludables, como el aceite de oliva, canola o girasol.  · Evite cocinar con manteca, crema o carnes con alto contenido de grasa.  Planificación de las comidas  · Consuma las comidas y las colaciones de forma regular, preferentemente a la misma hora todos los días. Evite pasar largos períodos de tiempo sin comer.  · Consuma alimentos ricos en fibra, como frutas frescas, verduras, frijoles y cereales integrales. Consulte al nutricionista sobre cuántas porciones de carbohidratos puede consumir en cada comida.  · Consuma entre 4 y 6 onzas de proteínas magras por día, como carnes magras, pollo, pescado, huevos o tofu. 1 onza equivale a 1 onza de carne, pollo o pescado, 1 huevo, o 1/4 taza de tofu.  · Coma algunos alimentos por día que contengan grasas saludables, como aguacates, frutos secos, semillas y pescado.  Estilo de vida     · Controle su nivel de glucemia con regularidad.  · Haga ejercicio al menos 30 minutos, 5 días o más por semana, o como se lo haya indicado el médico.  · Tome los medicamentos como se lo haya indicado el médico.  · No consuma ningún producto que contenga nicotina o tabaco, como cigarrillos y cigarrillos electrónicos. Si necesita ayuda para dejar de fumar, consulte al médico.  · Trabaje con un asesor o instructor en diabetes para identificar estrategias para controlar el estrés y cualquier desafío emocional y social.  ¿Cuáles son algunas de las preguntas que puedo hacerle a mi médico?  · ¿Es necesario que me reúna con un instructor en diabetes?  · ¿Es necesario que me reúna con un nutricionista?  · ¿A qué número puedo llamar si tengo preguntas?  · ¿Cuáles son los mejores momentos para controlar la glucemia?  Dónde encontrar más información:  · Asociación Americana de la Diabetes (American Diabetes Association): diabetes.org/food-and-fitness/food  · Academia de Nutrición y Dietética (Academy of Nutrition and Dietetics): www.eatright.org/resources/health/diseases-and-conditions/diabetes  · Instituto Nacional de la Diabetes y las Enfermedades Digestivas y [place] (National Institute of Diabetes and Digestive and Kidney Diseases) (Institutos Nacionales de Salud, NIH): www.niddk.nih.gov/health-information/diabetes/overview/diet-eating-physical-activity  Resumen  · Un plan de alimentación saludable   lo ayudará a controlar la glucemia y mantener un estilo de vida saludable.  · Trabajar con un especialista en dietas y nutrición (nutricionista) puede ayudarlo a elaborar el mejor plan de alimentación para usted.  · Tenga en cuenta que los carbohidratos y el alcohol tienen efectos inmediatos en sus niveles de glucemia. Es importante contar los carbohidratos y consumir alcohol con prudencia.  Esta información no tiene como fin reemplazar el consejo del médico. Asegúrese de hacerle al médico cualquier pregunta que tenga.   Document Released: 04/19/2007 Document Revised: 05/02/2016 Document Reviewed: 05/02/2016  Elsevier Interactive Patient Education © 2018 Elsevier Inc.

## 2017-07-03 NOTE — Progress Notes (Signed)
   Subjective:  Patient ID: Dustin Cox, male    DOB: 03/25/1970  Age: 47 y.o. MRN: [id number]  CC: f/u DM  HPI [name] is a 47 y.o. male with a medical history of DM2, HLD, and alcohol abuse presents to f/u on DM2. Last A1c 6.3% six months ago. Has had much dietary indiscretion. Feels well generally. Endorses occasional visual blurring attributed to age and eye strain from smart phone use. Has not yet been to the ophthalmologist but plans to go in the future. Does not endorse polydipsia, polyphagia, polyuria, fatigue, or tingling/numbness. Does not endorse any other symptoms or complaints.        Outpatient Medications Prior to Visit  Medication Sig Dispense Refill  . gemfibrozil (LOPID) 600 MG tablet Take 1 tablet (600 mg total) by mouth 2 (two) times daily before a meal. 180 tablet 1  . metFORMIN (GLUCOPHAGE) 500 MG tablet Take 1 tablet (500 mg total) by mouth 2 (two) times daily with a meal. 180 tablet 3   No facility-administered medications prior to visit.      ROS Review of Systems  Constitutional: Negative for chills, fever and malaise/fatigue.  Eyes: Negative for blurred vision.  Respiratory: Negative for shortness of breath.   Cardiovascular: Negative for chest pain and palpitations.  Gastrointestinal: Negative for abdominal pain and nausea.  Genitourinary: Negative for dysuria and hematuria.  Musculoskeletal: Negative for joint pain and myalgias.  Skin: Negative for rash.  Neurological: Negative for tingling and headaches.  Psychiatric/Behavioral: Negative for depression. The patient is not nervous/anxious.     Objective:  BP 121/80 (BP Location: Left Arm, Patient Position: Sitting, Cuff Size: Normal)   Pulse 82   Temp 97.9 F (36.6 C) (Oral)   Resp 18   Ht 5\' 9"  (1.753 m)   Wt 192 lb (87.1 kg)   SpO2 98%   BMI 28.35 kg/m   BP/Weight 07/03/2017 01/23/2017 10/14/2016  Systolic BP 121 122 104  Diastolic BP 80 84 72  Wt. (Lbs)  192 192.6 187  BMI 28.35 27.64 26.83      Physical Exam  Constitutional: He is oriented to person, place, and time.  Well developed, well nourished, NAD, polite  HENT:  Head: Normocephalic and atraumatic.  Eyes: No scleral icterus.  Neck: Normal range of motion. Neck supple. No thyromegaly present.  Cardiovascular: Normal rate, regular rhythm, normal heart sounds and intact distal pulses. Exam reveals no gallop and no friction rub.  No murmur heard. No carotid bruits bilaterally.  Pulmonary/Chest: Effort normal and breath sounds normal.  Musculoskeletal: He exhibits no edema.  Neurological: He is alert and oriented to person, place, and time.  Skin: Skin is warm and dry. No rash noted. No erythema. No pallor.  Psychiatric: He has a normal mood and affect. His behavior is normal. Thought content normal.  Vitals reviewed.    Assessment & Plan:    1. Type 2 diabetes mellitus without complication, without long-term current use of insulin (HCC) - HgB A1c 6.2%  - Comprehensive metabolic panel - Continue with Metformin - Advised to go to ophthalmologist for diabetic retinopathy screening.  2. Elevated lipoprotein(a) - Lipid panel - Comprehensive metabolic panel     Follow-up: Return for DM2.   Loletta Specteroger David Gomez PA

## 2017-07-04 LAB — COMPREHENSIVE METABOLIC PANEL
ALT: 23 IU/L (ref 0–44)
AST: 25 IU/L (ref 0–40)
Albumin/Globulin Ratio: 2.5 — ABNORMAL HIGH (ref 1.2–2.2)
Albumin: 5.2 g/dL (ref 3.5–5.5)
Alkaline Phosphatase: 64 IU/L (ref 39–117)
BUN/Creatinine Ratio: 14 (ref 9–20)
BUN: 14 mg/dL (ref 6–24)
Bilirubin Total: 0.9 mg/dL (ref 0.0–1.2)
CO2: 20 mmol/L (ref 20–29)
Calcium: 9.7 mg/dL (ref 8.7–10.2)
Chloride: 104 mmol/L (ref 96–106)
Creatinine, Ser: 1.03 mg/dL (ref 0.76–1.27)
GFR calc Af Amer: 101 mL/min/{1.73_m2} (ref >59)
GFR calc non Af Amer: 87 mL/min/{1.73_m2} (ref >59)
Globulin, Total: 2.1 g/dL (ref 1.5–4.5)
Glucose: 135 mg/dL — ABNORMAL HIGH (ref 65–99)
Potassium: 4.1 mmol/L (ref 3.5–5.2)
Sodium: 140 mmol/L (ref 134–144)
Total Protein: 7.3 g/dL (ref 6.0–8.5)

## 2017-07-04 LAB — LIPID PANEL
Chol/HDL Ratio: 3.2 ratio (ref 0.0–5.0)
Cholesterol, Total: 200 mg/dL — ABNORMAL HIGH (ref 100–199)
HDL: 62 mg/dL (ref >39)
LDL Calculated: 120 mg/dL — ABNORMAL HIGH (ref 0–99)
Triglycerides: 90 mg/dL (ref 0–149)
VLDL CHOLESTEROL CAL: 18 mg/dL (ref 5–40)

## 2017-07-07 ENCOUNTER — Telehealth (INDEPENDENT_AMBULATORY_CARE_PROVIDER_SITE_OTHER): Payer: Self-pay

## 2017-07-07 NOTE — Telephone Encounter (Signed)
Called patient using pacific interpreter [phone]([phone]) patients cousin answered and stated this number does not belong to the patient. But he was willing to take a message and have him call our office. Left message with cousin to ask patient to call [facility] at [phone]. If patient calls inform him that his Gemfibrozil is controlling his triglycerides well. However, his LDL is elevated. Its advised that he stop his dietary indiscretion( being a picky eater) He must eat healthier low fat and low carb meals. Maryjean Mornempestt S Porter Moes, CMA

## 2017-07-07 NOTE — Telephone Encounter (Signed)
-----   Message from Loletta Specteroger David Gomez, PA-C sent at 07/04/2017 12:34 PM EDT ----- Gemfibrozil is controlling his triglycerides well. However, his LDL is elevated and I advise that he stop his dietary indiscretion. He must eat healthier low fat and low carb meals.

## 2017-07-24 ENCOUNTER — Ambulatory Visit (INDEPENDENT_AMBULATORY_CARE_PROVIDER_SITE_OTHER): Payer: Self-pay | Admitting: Physician Assistant

## 2017-08-31 MED FILL — ?METFORMIN HCL 500MG TABS: 500 | 30 days supply | Qty: 60 | Fill #1

## 2017-08-31 MED FILL — GEMFIBROZIL 600 MG TAB: 600 | 30 days supply | Qty: 60 | Fill #1

## 2017-10-30 MED FILL — ?METFORMIN HCL 500MG TABS: 500 | 30 days supply | Qty: 60 | Fill #2

## 2017-10-30 MED FILL — GEMFIBROZIL 600 MG TAB: 600 | 30 days supply | Qty: 60 | Fill #2

## 2017-12-11 ENCOUNTER — Ambulatory Visit: Payer: Self-pay | Attending: Family Medicine

## 2017-12-26 MED FILL — GEMFIBROZIL 600 MG TAB: 600 | 30 days supply | Qty: 60 | Fill #3

## 2017-12-26 MED FILL — ?METFORMIN HCL 500MG TABL: 500 | 30 days supply | Qty: 60 | Fill #3

## 2018-01-03 ENCOUNTER — Ambulatory Visit (INDEPENDENT_AMBULATORY_CARE_PROVIDER_SITE_OTHER): Payer: Self-pay | Admitting: Physician Assistant

## 2018-01-03 ENCOUNTER — Encounter (INDEPENDENT_AMBULATORY_CARE_PROVIDER_SITE_OTHER): Payer: Self-pay | Admitting: Physician Assistant

## 2018-01-03 VITALS — BP 119/82 | HR 73 | Temp 98.0°F | Resp 16 | Ht 69.25 in | Wt 183.0 lb

## 2018-01-03 DIAGNOSIS — E781 Pure hyperglyceridemia: Secondary | ICD-10-CM

## 2018-01-03 DIAGNOSIS — F1510 Other stimulant abuse, uncomplicated: Secondary | ICD-10-CM

## 2018-01-03 DIAGNOSIS — Z91199 Patient's noncompliance with other medical treatment and regimen due to unspecified reason: Secondary | ICD-10-CM

## 2018-01-03 DIAGNOSIS — Z9119 Patient's noncompliance with other medical treatment and regimen: Secondary | ICD-10-CM

## 2018-01-03 DIAGNOSIS — E7841 Elevated Lipoprotein(a): Secondary | ICD-10-CM

## 2018-01-03 DIAGNOSIS — F1010 Alcohol abuse, uncomplicated: Secondary | ICD-10-CM

## 2018-01-03 DIAGNOSIS — E119 Type 2 diabetes mellitus without complications: Secondary | ICD-10-CM

## 2018-01-03 LAB — POCT GLYCOSYLATED HEMOGLOBIN (HGB A1C): HEMOGLOBIN A1C: 6 % — AB (ref 4.0–5.6)

## 2018-01-03 MED ORDER — FOLIC ACID 1 MG PO TABS: 1.0000 mg | ORAL_TABLET | Freq: Every day | ORAL | 1 refills | Status: DC

## 2018-01-03 MED ORDER — METFORMIN HCL 500 MG PO TABS: 500.0000 mg | ORAL_TABLET | Freq: Two times a day (BID) | ORAL | 1 refills | Status: DC

## 2018-01-03 MED ORDER — NALTREXONE HCL 50 MG PO TABS: 50.0000 mg | ORAL_TABLET | Freq: Every day | ORAL | 1 refills | Status: DC

## 2018-01-03 MED ORDER — GEMFIBROZIL 600 MG PO TABS: 600.0000 mg | ORAL_TABLET | Freq: Two times a day (BID) | ORAL | 1 refills | Status: DC

## 2018-01-03 NOTE — Patient Instructions (Addendum)
Lo que debe saber sobre el consumo excesivo y la dependencia del alcohol, en adultos What You Need To Know About Alcohol Abuse and Dependence, Adult El alcohol es una droga ampliamente disponible. Las personas que consumen alcohol lo consumirn en diferentes cantidades. Las personas que beben alcohol en exceso y tienen problemas de [place] durante y despus de beber alcohol, pueden tener lo que se llama trastorno por consumo de alcohol. El abuso del alcohol y la dependencia del alcohol son los dos tipos principales de trastornos por consumo de alcohol:  El abuso del alcohol se refiere a Advertising account executive cantidad de alcohol y con Animal nutritionist. Puede usar alcohol para sentirse Passenger transport manager o para reducir Development worker, community, pero puede tener dificultades para establecer un lmite en la cantidad que bebe.  La dependencia del alcohol se refiere a beber alcohol excesivamente durante un perodo determinado, y como resultado, el cuerpo y la qumica del cerebro sufren cambios. Esto hace que sea difcil dejar de beber porque puede empezar a sentirse enfermo o diferente si no consume alcohol.  Cmo pueden afectarme el abuso y la dependencia del alcohol? El abuso y la dependencia del alcohol pueden tener un efecto negativo en su vida. El uso excesivo de alcohol puede conllevar una adiccin. Puede sentir que necesita el alcohol para Production assistant, radio. Puede beber alcohol antes de ir al trabajo por la maana, durante el da o tan pronto como llega a su casa del trabajo por la noche. Estas acciones pueden causar lo siguiente:  Bajo rendimiento [employer].  Prdida del trabajo.  Problemas financieros.  Accidentes automovilsticos o cargos penales por conducir despus de beber alcohol.  Problemas en las relaciones con sus amigos y familiares.  Prdida de la confianza y del respeto de sus compaeros de [place], amigos y familiares.  El consumo excesivo de alcohol durante un perodo prolongado puede  daar el cuerpo y el cerebro de forma [place], y puede causar problemas de salud de por vida, como por ejemplo:  Enfermedad heptica.  Problemas cardacos, hipertensin arterial o accidentes cerebrovasculares.  Dao en el pncreas.  Ciertos tipos de cncer.  Reduccin de la capacidad para combatir las infecciones.  Hormigueo o adormecimiento de las manos y los pies (neuropata).  Dao cerebral.  Depresin.  Muerte temprana (prematura).  Cuando el cuerpo siente ansias de beber alcohol, es fcil beber ms que lo que el cuerpo Fish farm manager. [place], puede sufrir una sobredosis. La sobredosis de alcohol es una situacin grave que requiere hospitalizacin. Puede producir lesiones permanentes o la muerte. Cules son los beneficios de evitar el consumo de alcohol? Limitar o evitar el alcohol puede ayudarlo a:  Evitar riesgos para su cuerpo, cerebro y Associate Professor.  Evitar el riesgo de abusar o volverse dependiente del alcohol.  Mantener la mente y el cuerpo saludables. Como resultado, es ms probable a logre sus objetivos de vida.  Evitar lesiones permanentes, dao en los rganos o la muerte debido al consumo de alcohol.  Qu pasos puedo seguir para dejar de beber?  La mejor manera de evitar el abuso, la dependencia y la adiccin del alcohol es no beber nada de alcohol o beber en forma moderada. Beber en forma moderada significa no ms de 1 medida por da si es mujer y no est Orthoptist, y de 2 medidas por da si es hombre. Una medida equivale a 12oz ( ) de cerveza, 5oz ( ) de vino o 1oz (44ml) de bebidas alcohlicas de alta graduacin.  Deje de beber si ha  estado bebiendo demasiado. Esto puede ser muy difcil de hacer si est acostumbrado a abusar del alcohol. Si le resulta difcil dejar de beber, hable sobre su experiencia con alguien en quien confe. Esta persona puede ayudarlo a cambiar su comportamiento con respecto al alcohol.  En vez de beber alcohol,  haga otra cosa, como ejercicios o tenga un pasatiempo.  Busque maneras saludables de Engineer, agricultural, como hacer ejercicios o Landscape architect, o pasar tiempo con las personas que ms quiere.  En reuniones sociales y sitios donde puede haber alcohol, opte por tomar bebidas sin alcohol.  Si su familia, compaeros de trabajo o amigos beben, pdales que lo apoyen en sus esfuerzos por dejar de beber. Pdales que no beban a su alrededor. Pase ms tiempo con gente que no bebe alcohol.  Si cree que tiene un problema de dependencia de alcohol: ? [place]unteles a sus amigos o familiares acerca de sus inquietudes. ? Hable con su mdico u otro profesional de la [address] [place]. ? Trabaje con un terapeuta y un consejero de dependencia qumica. ? Considere unirse a un grupo de apoyo para personas que luchan contra el abuso, la dependencia y la adiccin al alcohol. Dnde encontrar apoyo: Puede obtener apoyo para la prevencin del abuso, la dependencia y la adiccin al alcohol de:  El profesional que lo asiste.  Alcohlicos Annimos (AA) (Alcoholics Anonymous): SalaryStart.tn  SMART Recovery: www.smartrecovery.org  Centros locales de tratamiento o consejeros de dependencia qumica.  Dnde encontrar ms informacin: Obtenga ms informacin sobre el abuso y la dependencia al alcohol de:  Centros para el control y la prevencin de enfermedades(Centers for Disease Control and Prevention, CDC): [place].es  [employer] sobre el Abuso del Alcohol y el Alcoholismo ([employer] on Alcohol Abuse and Alcoholism): CyberComps.hu  Grupos locales de AA en su comunidad.  Comunquese con un mdico si:  Bebe ms o por ms tiempo de lo previsto, en ms de una ocasin.  Trat de dejar de beber alcohol o de reducir la cantidad de alcohol que consume, pero no pudo lograrlo.  Bebe a menudo hasta el  punto de vomitar o perder la conciencia.  Ansa tanto beber alcohol que no puede pensar en otra cosa.  Beber ha creado problemas en su vida, pero contina bebiendo.  Contina bebiendo aunque se siente ansioso, deprimido o ha experimentado una prdida de Architect.  Dej de hacer las cosas que sola disfrutar para beber alcohol.  Debe beber ms de lo que sola para Wellsite geologist que desea.  Tiene ansiedad, sudoracin, nuseas, temblores y problemas para dormir cuando trata de dejar de beber.  Tiene pensamientos acerca de Runner, broadcasting/film/video o daar a Economist. Si alguna vez siente que puede lastimarse o Physicist, medical a los dems, o tiene pensamientos de poner fin a su vida, busque ayuda de inmediato. Puede dirigirse al servicio de urgencias ms cercano o comunicarse con:  El servicio de emergencias de su localidad (911 en [place].).  Una lnea de asistencia al suicida y Visual merchandiser en crisis, como la [employer] de Prevencin del Suicidio (National Suicide Prevention Lifeline) al [phone]. Est disponible las 24 horas del da.  Resumen  El alcohol es una droga ampliamente disponible. El uso indebido, el abuso y la dependencia del alcohol pueden causar muchos problemas.  Es importante que mida y limite la cantidad de alcohol que consume. Se recomienda que limite su consumo de alcohol a por da si es mujer y no est [place], y por da si es  hombre.  Los riesgos asociados con beber [employer] tendrn un impacto negativo directo en su trabajo, relaciones y [place].  Si se da cuenta de que tiene problemas para Pharmacologist su consumo de alcohol bajo control, busque formas de Multimedia programmer su comportamiento. Los pasatiempos, [address] para calmarse y Lexicographer, el ejercicio o los grupos de apoyo pueden Contractor.  Si cree que necesita ayuda para cambiar sus hbitos de consumo de alcohol, hable con su mdico, un buen amigo o un terapeuta, o vaya a un grupo de AA. Esta  informacin no tiene Theme park manager el consejo del mdico. Asegrese de hacerle al mdico cualquier pregunta que tenga. Document Released: 05/23/2016 Document Revised: 05/23/2016 Document Reviewed: 05/23/2016 Elsevier Interactive Patient Education  2018 [employer].    Trastorno por consumo de sustancias (Substance Use Disorder) El trastorno por consumo de sustancias aparece cuando el uso repetido de drogas o alcohol interfiere en la capacidad de ser productivo. Este trastorno puede causar problemas en la salud mental y fsica. Puede afectar la capacidad de tener relaciones sanas e impedir que una persona cumpla con sus responsabilidades en el trabajo, en el hogar o en el entorno educativo. Tambin puede causar [employer]. Entre las sustancias que causan este trastorno con ms frecuencia, se incluyen las siguientes:  Alcohol.  Tabaco.  Marihuana.  Estimulantes, como la cocana y Education officer, environmental.  Alucingenos, como LSD y PCP.  Opioides, como herona y algunos analgsicos recetados. CAUSAS Esta afeccin puede aparecer por razones sociales, psicolgicas o fsicas. Estas incluyen, por ejemplo:  Estrs.  Abuso.  Presin de [employer].  Ansiedad.  Depresin. FACTORES DE RIESGO Es ms probable que esta afeccin se manifieste en las personas que:  Estn estresadas.  Han sufrido un abuso.  Tienen un trastorno de salud mental, como depresin.  Nacen con determinados genes. SNTOMAS Los sntomas de esta afeccin incluyen lo siguiente:  Uso de la sustancia durante perodos ms prolongados o en dosis ms altas de lo que es normal o lo que est previsto.  Deseo prolongado de Curator.  Imposibilidad de disminuir o frenar el uso de la sustancia.  Prdida de una cantidad anormal de tiempo buscando, consumiendo o recuperndose del consumo de la sustancia.  Deseo compulsivo de la sustancia.  Uso de la sustancia que causa lo siguiente: ? Interfiere en el  trabajo, la escuela o la vida en Advice worker. ? Interfiere en las relaciones personales y [place]. ? Obliga a abandonar actividades que la persona alguna vez disfrut o Licensed conveyancer.  Uso de la sustancia a pesar de lo siguiente: ? Sabe que es peligroso o malo para Radiographer, therapeutic. ? Sabe que le est causando problemas en la vida.  Necesidad cada vez mayor de la sustancia para obtener el mismo efecto (desarrolla tolerancia).  Aparicin de sntomas fsicos si no [place] la sustancia (abstinencia).  Uso de la sustancia para evitar la abstinencia. DIAGNSTICO Esta afeccin se puede diagnosticar en funcin de lo siguiente:  Sus antecedentes de consumo de sustancias.  La forma en la que el abuso de sustancias influye en su vida.  La presencia de al [employer] sntomas de este trastorno en un perodo de . El mdico tambin puede hacerle anlisis de [place] y [place] para Engineer, manufacturing alcohol y drogas. TRATAMIENTO El tratamiento de este trastorno puede incluir lo siguiente:  Interrumpir el uso de sustancias de forma segura. Para esto, puede ser necesario un tratamiento con medicamentos y [place] supervisin estricta durante varios das.  Recibir asesoramiento grupal e individual de  profesionales especialistas en salud mental que ayudan a las personas con trastorno por consumo de sustancias.  Permanecer en Heritage managerun centro de tratamiento residencial durante varios das o semanas.  Asistir a sesiones diarias de asesoramiento psicolgico en un centro de tratamiento.  Tomar los [employer] Companymedicamentos como se lo haya indicado el mdico, con estos fines: ? ParamedicAliviar los sntomas y Automotive engineerevitar las complicaciones durante la abstinencia. ? Tratar otros problemas de salud mental, como depresin o ansiedad. ? Detener el impulso de consumir al causar los mismos efectos que la sustancia. ? Bloquear los efectos de la sustancia o Microbiologistreemplazar las sensaciones placenteras por sensaciones displacenteras.  Asistir a un grupo de apoyo para compartir su  experiencia con otras personas que estn pasando por lo mismo. Estos grupos son Neomia Dearuna parte importante de la recuperacin a largo plazo para [place] personas. Incluyen programas de 12pasos, como Alcohlicos Annimos o Narcticos Annimos. La recuperacin puede ser un proceso prolongado. Muchas personas que FPL Grouphacen el tratamiento comienzan a usar la sustancia de nuevo despus de haberla dejado. Esto se conoce como recada. Si tiene una recada, esto no significa que el tratamiento no funcionar. INSTRUCCIONES PARA EL CUIDADO EN EL HOGAR  Tome los medicamentos de venta libre y los recetados solamente como se lo haya indicado el mdico.  No consuma ningn tipo de drogas o alcohol.  Concurra a todas las visitas de control como se lo haya indicado el mdico. Esto es importante. Esto significa seguir trabajando con los terapeutas, los mdicos y los grupos de apoyo. SOLICITE ATENCIN MDICA SI:  No puede tomar los Mons[employer] Companymedicamentos como se lo han indicado.  Los sntomas empeoran.  Tiene dificultades para resistir el impulso de consumir drogas o alcohol. SOLICITE ATENCIN MDICA DE INMEDIATO SI:  Tiene pensamientos serios acerca de lastimarse a usted mismo o daar a Engineer, maintenance (IT)otra persona.  Tiene una recada. Esta informacin no tiene Theme park managercomo fin reemplazar el consejo del mdico. Asegrese de hacerle al mdico cualquier pregunta que tenga. Document Released: 04/28/2008 Document Revised: 05/04/2015 Document Reviewed: 01/15/2014 Elsevier Interactive Patient Education  [employer]2018 Elsevier Inc.

## 2018-01-03 NOTE — Progress Notes (Signed)
Subjective:  Patient ID: Dustin Cox, male    DOB: 04/10/1970  Age: 47 y.o. MRN: [id number]  CC: f/u DM2  HPI Dustin Yehuda SavannahBarrera Gonzalezis a 47 y.o.malewith a medical history of DM2, HLD, alcohol abuse, and crystal meth use presents to f/u on DM2. Last A1c 6.2% six months ago. Pt is not dieting or exercising. Taking Metformin sporadically. A1c 6.0% today. Reports feeling well and denies any symptoms.     In regards to alcohol abuse, pt denies daily drinking but estimates that he drinks 40-50 12 oz beers per week. Says he also uses crystal meth but only feels the urge to use meth when he is drinking. Has tried and failed acamprosate and disulfram. Pt desires to stop drinking. He values his wife and daughter. Has been able to stop drinking before when he became involved with the church. Had been sober for three months with absolutely no urge to drink. Unfortunately he eventually stopped going to church and began drinking again.      Outpatient Medications Prior to Visit  Medication Sig Dispense Refill  . gemfibrozil (LOPID) 600 MG tablet Take 1 tablet (600 mg total) by mouth 2 (two) times daily before a meal. 180 tablet 1  . metFORMIN (GLUCOPHAGE) 500 MG tablet Take 1 tablet (500 mg total) by mouth 2 (two) times daily with a meal. 180 tablet 3   No facility-administered medications prior to visit.      ROS Review of Systems  Constitutional: Negative for chills, fever and malaise/fatigue.  Eyes: Negative for blurred vision.  Respiratory: Negative for shortness of breath.   Cardiovascular: Negative for chest pain and palpitations.  Gastrointestinal: Negative for abdominal pain and nausea.  Genitourinary: Negative for dysuria and hematuria.  Musculoskeletal: Negative for joint pain and myalgias.  Skin: Negative for rash.  Neurological: Negative for tingling and headaches.  Psychiatric/Behavioral: Negative for depression. The patient is not nervous/anxious.      Objective:  There were no vitals taken for this visit.  BP/Weight 07/03/2017 01/23/2017 10/14/2016  Systolic BP 121 122 104  Diastolic BP 80 84 72  Wt. (Lbs) 192 192.6 187  BMI 28.35 27.64 26.83      Physical Exam  Constitutional: He is oriented to person, place, and time.  Well developed, well nourished, NAD, polite  HENT:  Head: Normocephalic and atraumatic.  Eyes: No scleral icterus.  Neck: Normal range of motion. Neck supple. No thyromegaly present.  Cardiovascular: Normal rate, regular rhythm and normal heart sounds.  Pulmonary/Chest: Effort normal and breath sounds normal.  Abdominal: Soft. Bowel sounds are normal. There is no tenderness.  Musculoskeletal: He exhibits no edema.  Neurological: He is alert and oriented to person, place, and time.  Skin: Skin is warm and dry. No rash noted. No erythema. No pallor.  Psychiatric: He has a normal mood and affect. His behavior is normal. Thought content normal.  Vitals reviewed.    Assessment & Plan:   1. Type 2 diabetes mellitus without complication, without long-term current use of insulin (HCC) - HgB A1c - Comprehensive metabolic panel - Lipid panel - Refill Metformin 500 mg BID  2. Hypertriglyceridemia - Comprehensive metabolic panel - Lipid panel - Refill Gemfibrozil 600 mg BID  3. Elevated lipoprotein(a) - Lipid panel - CMP  4. Alcohol abuse - Failed acamprosate and disulfram. - I have messaged Mrs. Jenel LucksJasmine Lewis, LCSW, to reach out to pt to discuss options for rehabilitation programs.  - naltrexone (DEPADE) 50 MG tablet; Take 1 tablet (50  mg total) by mouth daily.  Dispense: 30 tablet; Refill: 1 - folic acid (FOLVITE) 1 MG tablet; Take 1 tablet (1 mg total) by mouth daily.  Dispense: 30 tablet; Refill: 1  5. Methamphetamine use (HCC) - I have messaged Mrs. Jenel Lucks, [place], to reach out to pt to discuss options for rehabilitation programs.   6. Noncompliance - Attributed to drunkenness and  forgetting to take his medications as directed.    Meds ordered this encounter  Medications  . naltrexone (DEPADE) 50 MG tablet    Sig: Take 1 tablet (50 mg total) by mouth daily.    Dispense:  30 tablet    Refill:  1    Order Specific Question:   Supervising Provider    Answer:   Dustin Cox [[id number]]  . folic acid (FOLVITE) 1 MG tablet    Sig: Take 1 tablet (1 mg total) by mouth daily.    Dispense:  30 tablet    Refill:  1    Order Specific Question:   Supervising Provider    Answer:   Dustin Cox [[id number]]    Follow-up: Return in about 26 days (around 01/29/2018).   Loletta Specter PA

## 2018-01-04 LAB — LIPID PANEL
CHOLESTEROL TOTAL: 194 mg/dL (ref 100–199)
Chol/HDL Ratio: 3.1 ratio (ref 0.0–5.0)
HDL: 63 mg/dL (ref >39)
LDL Calculated: 107 mg/dL — ABNORMAL HIGH (ref 0–99)
Triglycerides: 118 mg/dL (ref 0–149)
VLDL CHOLESTEROL CAL: 24 mg/dL (ref 5–40)

## 2018-01-04 LAB — COMPREHENSIVE METABOLIC PANEL
ALBUMIN: 4.6 g/dL (ref 3.5–5.5)
ALT: 13 IU/L (ref 0–44)
AST: 16 IU/L (ref 0–40)
Albumin/Globulin Ratio: 2.4 — ABNORMAL HIGH (ref 1.2–2.2)
Alkaline Phosphatase: 66 IU/L (ref 39–117)
BILIRUBIN TOTAL: 1.1 mg/dL (ref 0.0–1.2)
BUN/Creatinine Ratio: 12 (ref 9–20)
BUN: 13 mg/dL (ref 6–24)
CO2: 23 mmol/L (ref 20–29)
CREATININE: 1.1 mg/dL (ref 0.76–1.27)
Calcium: 9.4 mg/dL (ref 8.7–10.2)
Chloride: 102 mmol/L (ref 96–106)
GFR calc Af Amer: 93 mL/min/{1.73_m2} (ref >59)
GFR calc non Af Amer: 80 mL/min/{1.73_m2} (ref >59)
Globulin, Total: 1.9 g/dL (ref 1.5–4.5)
Glucose: 107 mg/dL — ABNORMAL HIGH (ref 65–99)
Potassium: 4.4 mmol/L (ref 3.5–5.2)
Sodium: 140 mmol/L (ref 134–144)
TOTAL PROTEIN: 6.5 g/dL (ref 6.0–8.5)

## 2018-01-05 ENCOUNTER — Telehealth (INDEPENDENT_AMBULATORY_CARE_PROVIDER_SITE_OTHER): Payer: Self-pay

## 2018-01-05 NOTE — Telephone Encounter (Signed)
-----   Message from Loletta Specteroger David Gomez, PA-C sent at 01/04/2018 12:01 PM EST ----- Mildly elevated cholesterol, just avoid eating fatty foods. Triglycerides are much improved. Continue gemfibrozil.

## 2018-01-05 NOTE — Telephone Encounter (Signed)
Call placed using pacific interpreter [phone]([phone]) patient is aware that cholesterol is mildly elevated, avoid eating fatty foods. triglycerides are much improved continue taking gemfibrozil. Patient expressed understanding. Did not have any questions.  Maryjean Mornempestt S Joud Ingwersen, CMA

## 2018-01-15 ENCOUNTER — Telehealth: Payer: Self-pay | Admitting: Licensed Clinical Social Worker

## 2018-01-15 NOTE — Telephone Encounter (Signed)
LCSWA placed call to patient with the [facility](Roberto Javier# [id number]) to follow up on consult from PCP. A message requesting a return call was left.

## 2018-01-19 ENCOUNTER — Telehealth: Payer: Self-pay | Admitting: Licensed Clinical Social Worker

## 2018-01-19 NOTE — Telephone Encounter (Signed)
LCSWA placed call to patient with the assistance from [facility] (# [phone]) to follow up on consult from PCP. A message requesting a return call was left with pt's son.

## 2018-02-28 ENCOUNTER — Ambulatory Visit (INDEPENDENT_AMBULATORY_CARE_PROVIDER_SITE_OTHER): Payer: Self-pay | Admitting: Nurse Practitioner

## 2018-03-01 ENCOUNTER — Ambulatory Visit: Payer: Self-pay | Attending: Primary Care | Admitting: Primary Care

## 2018-03-01 ENCOUNTER — Encounter: Payer: Self-pay | Admitting: Primary Care

## 2018-03-01 VITALS — BP 125/87 | HR 74 | Temp 98.2°F | Ht 69.25 in | Wt 185.6 lb

## 2018-03-01 DIAGNOSIS — R52 Pain, unspecified: Secondary | ICD-10-CM

## 2018-03-01 DIAGNOSIS — G4483 Primary cough headache: Secondary | ICD-10-CM

## 2018-03-01 DIAGNOSIS — Z79899 Other long term (current) drug therapy: Secondary | ICD-10-CM | POA: Insufficient documentation

## 2018-03-01 DIAGNOSIS — Z716 Tobacco abuse counseling: Secondary | ICD-10-CM

## 2018-03-01 DIAGNOSIS — E119 Type 2 diabetes mellitus without complications: Secondary | ICD-10-CM

## 2018-03-01 DIAGNOSIS — Z8349 Family history of other endocrine, nutritional and metabolic diseases: Secondary | ICD-10-CM | POA: Insufficient documentation

## 2018-03-01 DIAGNOSIS — M7918 Myalgia, other site: Secondary | ICD-10-CM

## 2018-03-01 DIAGNOSIS — F1021 Alcohol dependence, in remission: Secondary | ICD-10-CM

## 2018-03-01 DIAGNOSIS — F17200 Nicotine dependence, unspecified, uncomplicated: Secondary | ICD-10-CM

## 2018-03-01 DIAGNOSIS — Z7984 Long term (current) use of oral hypoglycemic drugs: Secondary | ICD-10-CM | POA: Insufficient documentation

## 2018-03-01 DIAGNOSIS — R05 Cough: Secondary | ICD-10-CM | POA: Insufficient documentation

## 2018-03-01 DIAGNOSIS — E785 Hyperlipidemia, unspecified: Secondary | ICD-10-CM | POA: Insufficient documentation

## 2018-03-01 DIAGNOSIS — F17210 Nicotine dependence, cigarettes, uncomplicated: Secondary | ICD-10-CM | POA: Insufficient documentation

## 2018-03-01 MED ORDER — GUAIFENESIN-DM 100-10 MG/5ML PO SYRP: 5.0000 mL | ORAL_SOLUTION | ORAL | 0 refills | Status: DC | PRN

## 2018-03-01 NOTE — Patient Instructions (Signed)
 \ Virus respiratorio sincicial en los adultos Respiratory Syncytial Virus, Adult  El virus respiratorio sincicial (VRS) causa una infeccin viral frecuente. Se trata de un virus que es similar a los virus que provocan sntomas de resfriado y gripe. El VRS puede afectar la nariz, la garganta y las vas respiratorias superiores (aparato respiratorio superior), as como la trquea y los pulmones (aparato respiratorio inferior). Si el VRS afecta las vas respiratorias en los pulmones, usted tiene bronquiolitis. Si el VRS le afecta los pulmones, tiene neumona. El VRS se transmite de una persona a otra (es contagioso) a travs de gotitas de la tos y de los estornudos (secreciones respiratorias). El VRS rara vez provoca un cuadro grave cuando se presenta en adultos. Cules son las causas? La causa de la infeccin por VRS es el virus respiratorio sincicial. Cuando una persona enferma tose o estornuda, despide gotitas que contienen partculas del virus. Usted puede enfermarse si respira (inhala) esas gotitas. El virus tambin es capaz de sobrevivir durante un tiempo en las gotitas que caen sobre superficies. Si toca una superficie contaminada y luego se toca la cara, el virus puede introducirse en el cuerpo a travs de la boca, de la nariz o de los ojos. El virus tambin se transmite a travs de besos, contacto cercano o compartir utensilios para comer y beber. Qu incrementa el riesgo? Puede tener mayor riesgo de sufrir una infeccin por VRS si:  Tiene 65aos o ms.  Sufre una afeccin pulmonar prolongada (crnica), como EPOC.  Tiene debilitado el sistema encargado de combatir las enfermedades (sistema inmunitario).  Tiene sndrome de [place].  Tiene una enfermedad cardaca.  Trabaja en un hospital o en otro centro mdico.  Vive en un centro mdico de larga estancia (para enfermos crnicos). Cules son los signos o los sntomas? Los sntomas de la infeccin por VRS incluyen los  siguientes:  Secrecin nasal.  Tos. Puede tener tos que provoca mucosidad (tos productiva).  Estornudos.  Fiebre.  Prdida del apetito.  Respiracin ruidosa (sibilancias).  Falta de aire.  Acumulacin de lquido en los pulmones (dificultad respiratoria). Cmo se diagnostica? Esta afeccin se puede diagnosticar en funcin de lo siguiente:  Sus sntomas.  Sus antecedentes mdicos.  Un examen fsico.  Una radiografa para descartar la neumona.  Anlisis de sangre o de la mucosidad de los pulmones (esputo). Estos anlisis pueden realizarse si es mayor.  Un anlisis de sus secreciones respiratorias. Cmo se trata? En la mayora de los casos, la infeccin por VRS desaparece despus de 1 o 2semanas de cuidados en su casa. Si tiene una infeccin grave por VRS y presenta neumona, es posible que necesite tratamiento en el hospital con oxgeno, antibiticos y medicamentos para abrir los bronquios (broncodilatadores). Siga estas indicaciones en su casa:  Tome los medicamentos de venta libre y los recetados solamente como se lo haya indicado el mdico.  Beba suficiente lquido para mantener la orina clara o de color amarillo plido.  Descanse en su casa hasta que los sntomas desaparezcan.  Consuma una dieta saludable.  No consuma ningn producto que contenga nicotina o tabaco, como cigarrillos y cigarrillos electrnicos. Si necesita ayuda para dejar de fumar, consulte al mdico.  Concurra a todas las visitas de control como se lo haya indicado el mdico. Esto es importante. Cmo se evita? Para evitar contagiarse y transmitir el virus respiratorio sincicial (VRS):  Lvese las manos frecuentemente con agua y jabn. Use desinfectante para manos con alcohol si no dispone de agua y jabn. Si no   se limpi las manos, no se toque la cara.  Si tiene sntomas similares a los del resfriado o a los de la gripe, qudese en casa.  Cuando tosa o estornude, cbrase la nariz y la  boca.  Evite los grupos grandes de personas.  Mantngase a una distancia segura de las personas cuando tosa o estornude.  Comunquese con un mdico si:  Los sntomas empeoran.  Los sntomas no mejoran despus de 2semanas.  Tiene fiebre.  Contina con transpiracin (sudoracin), acaloramiento o escalofros (sntomas persistentes).  Tose y elimina mucha ms mucosidad que lo habitual.  Tose y escupe sangre.  Se siente muy cansado (est aletargado).  Se siente confundido.  Tiene dificultad respiratoria que empeora. Esta informacin no tiene como fin reemplazar el consejo del mdico. Asegrese de hacerle al mdico cualquier pregunta que tenga. Document Released: 05/25/2016 Document Revised: 05/25/2016 Document Reviewed: 06/23/2015 Elsevier Interactive Patient Education  2019 Elsevier Inc.  

## 2018-03-01 NOTE — Progress Notes (Signed)
Acute Office Visit  Subjective:    Patient ID: Dustin Cox, male    DOB: [birth year], 48 y.o.   MRN: [id number]    HPI Patient is in today for an acute visit has c/o fever for 5 days resolved ,presently cont. To have chills, productive cough and body aches.  He has a past medical hx of hypertriglyceridemia, T2D, smoker and lumbar muscle strain.   Past Medical History:  Diagnosis Date  . Hyperlipidemia 2014    No past surgical history on file.  Family History  Problem Relation Age of Onset  . Hyperlipidemia Mother   . Early death Father     Social History   Socioeconomic History  . Marital status: Married    Spouse name: Not on file  . Number of children: Not on file  . Years of education: Not on file  . Highest education level: Not on file  Occupational History  . Not on file  Social Needs  . Financial resource strain: Not on file  . Food insecurity:    Worry: Not on file    Inability: Not on file  . Transportation needs:    Medical: Not on file    Non-medical: Not on file  Tobacco Use  . Smoking status: Light Tobacco Smoker    Packs/day: 0.25    Years: 30.00    Pack years: 7.50    Types: Cigarettes  . Smokeless tobacco: Never Used  Substance and Sexual Activity  . Alcohol use: Yes    Alcohol/week: 0.0 standard drinks    Comment: drinks about 7-9 beers every other weekend  . Drug use: Yes    Types: Cocaine    Comment: last used 1 year ago  . Sexual activity: Yes    Partners: Female  Lifestyle  . Physical activity:    Days per week: Not on file    Minutes per session: Not on file  . Stress: Not on file  Relationships  . Social connections:    Talks on phone: Not on file    Gets together: Not on file    Attends religious service: Not on file    Active member of club or organization: Not on file    Attends meetings of clubs or organizations: Not on file    Relationship status: Not on file  . Intimate partner violence:    Fear of  current or ex partner: Not on file    Emotionally abused: Not on file    Physically abused: Not on file    Forced sexual activity: Not on file  Other Topics Concern  . Not on file  Social History Narrative   Lives at home with wife and 3 children.  Currently unemployed.  Used to work in [employer].    Outpatient Medications Prior to Visit  Medication Sig Dispense Refill  . folic acid (FOLVITE) 1 MG tablet Take 1 tablet (1 mg total) by mouth daily. 30 tablet 1  . gemfibrozil (LOPID) 600 MG tablet Take 1 tablet (600 mg total) by mouth 2 (two) times daily before a meal. 180 tablet 1  . metFORMIN (GLUCOPHAGE) 500 MG tablet Take 1 tablet (500 mg total) by mouth 2 (two) times daily with a meal. 180 tablet 1  . naltrexone (DEPADE) 50 MG tablet Take 1 tablet (50 mg total) by mouth daily. 30 tablet 1   No facility-administered medications prior to visit.     No Known Allergies  Review of Systems  Constitutional: Positive  for chills, fever and malaise/fatigue.  HENT: Negative.   Eyes: Negative.   Respiratory: Positive for cough.   Cardiovascular: Negative.   Gastrointestinal: Positive for heartburn.  Genitourinary: Negative.   Musculoskeletal: Positive for back pain.  Neurological: Negative.   Endo/Heme/Allergies: Negative.   Psychiatric/Behavioral: Negative.        Objective:    Physical Exam  Constitutional: He is oriented to person, place, and time. He appears well-developed.  HENT:  Head: Normocephalic.  Neck: Normal range of motion.  Cardiovascular: Normal rate and regular rhythm.  Pulmonary/Chest: Effort normal and breath sounds normal.  Musculoskeletal: Normal range of motion.  Neurological: He is oriented to person, place, and time.  Skin: Skin is warm and dry.  Psychiatric: He has a normal mood and affect.    There were no vitals taken for this visit. Wt Readings from Last 3 Encounters:  01/03/18 183 lb (83 kg)  07/03/17 192 lb (87.1 kg)  01/23/17 192 lb 9.6  oz (87.4 kg)    Health Maintenance Due  Topic Date Due  . PNEUMOCOCCAL POLYSACCHARIDE VACCINE AGE 53-64 HIGH RISK  09/26/1973  . OPHTHALMOLOGY EXAM  09/26/1981  . INFLUENZA VACCINE  08/24/2017  . URINE MICROALBUMIN  09/09/2017    There are no preventive care reminders to display for this patient.   Lab Results  Component Value Date   TSH 2.186 08/08/2007   Lab Results  Component Value Date   WBC 6.5 06/09/2016   HGB 16.2 06/09/2016   HCT 48.3 06/09/2016   MCV 89 06/09/2016   PLT 170 06/09/2016   Lab Results  Component Value Date   NA 140 01/03/2018   K 4.4 01/03/2018   CO2 23 01/03/2018   GLUCOSE 107 (H) 01/03/2018   BUN 13 01/03/2018   CREATININE 1.10 01/03/2018   BILITOT 1.1 01/03/2018   ALKPHOS 66 01/03/2018   AST 16 01/03/2018   ALT 13 01/03/2018   PROT 6.5 01/03/2018   ALBUMIN 4.6 01/03/2018   CALCIUM 9.4 01/03/2018   Lab Results  Component Value Date   CHOL 194 01/03/2018   Lab Results  Component Value Date   HDL 63 01/03/2018   Lab Results  Component Value Date   LDLCALC 107 (H) 01/03/2018   Lab Results  Component Value Date   TRIG 118 01/03/2018   Lab Results  Component Value Date   CHOLHDL 3.1 01/03/2018   Lab Results  Component Value Date   HGBA1C 6.0 (A) 01/03/2018       Assessment & Plan:  Dustin Cox was seen today for cough.  Diagnoses and all orders for this visit:  Cough headache screen  -     Influenza A/B r/o flu s/s body aches, chills and fever results was negative. Tx cough with OTC Guaifenesin DM take as directed.  Generalized body aches -     Influenza A/B r/o negative may use OTC ibuprofen and tylenol if fever returns  Type 2 diabetes mellitus without complication, without long-term current use of insulin (HCC) point of care addressed . A1C 6.0 cont metformin as prescribed  -     Microalbumin, urine -     Ambulatory referral to Ophthalmology  Encounter for smoking cessation counseling Each visit discuss cessation  since this respiratory infection cigerette smoking has reduced significantly   Problem List Items Addressed This Visit    None      Referral made for opthalmopathy   Grayce Sessions, NP

## 2018-03-02 LAB — MICROALBUMIN, URINE: MICROALBUM., U, RANDOM: 3.1 ug/mL

## 2018-03-03 ENCOUNTER — Encounter: Payer: Self-pay | Admitting: Primary Care

## 2018-03-03 DIAGNOSIS — F1021 Alcohol dependence, in remission: Secondary | ICD-10-CM | POA: Insufficient documentation

## 2018-03-08 LAB — POCT INFLUENZA A/B
Influenza A, POC: NEGATIVE
Influenza B, POC: NEGATIVE

## 2018-03-08 MED FILL — metFORMIN HCL 500 MG TABS: 500 | 30 days supply | Qty: 60 | Fill #4

## 2018-03-08 MED FILL — GEMFIBROZIL 600 MG TAB: 600 | 30 days supply | Qty: 60 | Fill #4

## 2018-04-24 ENCOUNTER — Ambulatory Visit (INDEPENDENT_AMBULATORY_CARE_PROVIDER_SITE_OTHER): Payer: Self-pay | Admitting: Primary Care

## 2018-05-22 MED FILL — GEMFIBROZIL 600 MG TAB: 600 | 30 days supply | Qty: 60 | Fill #5

## 2018-05-22 MED FILL — metFORMIN HCL 500 MG TABS: 500 | 30 days supply | Qty: 60 | Fill #5

## 2018-05-30 ENCOUNTER — Other Ambulatory Visit: Payer: Self-pay

## 2018-05-30 ENCOUNTER — Ambulatory Visit: Payer: Self-pay | Attending: Primary Care | Admitting: Primary Care

## 2018-05-30 DIAGNOSIS — E119 Type 2 diabetes mellitus without complications: Secondary | ICD-10-CM

## 2018-05-30 DIAGNOSIS — F17200 Nicotine dependence, unspecified, uncomplicated: Secondary | ICD-10-CM

## 2018-05-30 DIAGNOSIS — M549 Dorsalgia, unspecified: Secondary | ICD-10-CM

## 2018-05-30 NOTE — Progress Notes (Signed)
Patient verified DOB Patient used 2 spanish medications for pain which is ibuprofen with relief. Patient complains of right side pain from supporting concrete on his side from work on Saturday. Pain is constant at a 3 and increases when he burps or twist.

## 2018-07-02 ENCOUNTER — Encounter: Payer: Self-pay | Admitting: Primary Care

## 2018-07-02 NOTE — Progress Notes (Signed)
Virtual Visit via Telephone Note  I connected with Dustin Cox on 05/30/2018 at  3:30 PM EDT by telephone and verified that I am speaking with the correct person using two identifiers.   I discussed the limitations, risks, security and privacy concerns of performing an evaluation and management service by telephone and the availability of in person appointments. I also discussed with the patient that there may be a patient responsible charge related to this service. The patient expressed understanding and agreed to proceed.   History of Present Illness: Mr. [name] is being seen today for right side pain. 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  Past medical hx of hypertriglyceridemia, T2D, smoker and lumbar muscle strain.    Observations/Objective: Review of Systems  Constitutional: Negative.   HENT: Negative.   Eyes: Negative.   Respiratory: Negative.   Cardiovascular: Negative.   Gastrointestinal: Negative.   Genitourinary: Negative.   Musculoskeletal: Positive for back pain.  Skin: Negative.   Neurological: Negative.   Endo/Heme/Allergies: Negative.   Psychiatric/Behavioral: Negative.     Assessment and Plan: Diagnoses and all orders for this visit:  Type 2 diabetes mellitus without complication, without long-term current use of insulin ([facility])  Last 01/04/2018 6.0 . Has metformin prescribed.  Acute right-sided back pain, unspecified back location Secondary to job and lifting heavy objects ie concrete.  Smoker Each visit d/w cessation and increase risk of MI or stroke.   Follow Up Instructions:    I discussed the assessment and treatment plan with the patient. The patient was provided an opportunity to ask questions and all were answered. The patient agreed with the plan and demonstrated an understanding of the instructions.   The patient was advised to call back or seek an in-person evaluation if the symptoms worsen or if the condition fails to  improve as anticipated.  I provided 12 minutes of non-face-to-face time during this encounter.   Kerin Perna, NP

## 2018-09-19 MED FILL — GEMFIBROZIL 600 MG TAB: 600 | 30 days supply | Qty: 60 | Fill #0

## 2018-09-19 MED FILL — metFORMIN HCL 500 MG TABS: 500 | 30 days supply | Qty: 60 | Fill #0

## 2019-04-03 ENCOUNTER — Ambulatory Visit (INDEPENDENT_AMBULATORY_CARE_PROVIDER_SITE_OTHER): Payer: Self-pay | Admitting: Primary Care

## 2019-04-15 ENCOUNTER — Ambulatory Visit (INDEPENDENT_AMBULATORY_CARE_PROVIDER_SITE_OTHER): Payer: Self-pay | Admitting: Family Medicine

## 2019-04-15 ENCOUNTER — Other Ambulatory Visit: Payer: Self-pay

## 2019-04-15 ENCOUNTER — Encounter (INDEPENDENT_AMBULATORY_CARE_PROVIDER_SITE_OTHER): Payer: Self-pay | Admitting: Family Medicine

## 2019-04-15 VITALS — BP 139/92 | HR 82 | Temp 97.3°F | Ht 69.0 in | Wt 192.4 lb

## 2019-04-15 DIAGNOSIS — E781 Pure hyperglyceridemia: Secondary | ICD-10-CM

## 2019-04-15 DIAGNOSIS — E7841 Elevated Lipoprotein(a): Secondary | ICD-10-CM

## 2019-04-15 DIAGNOSIS — G4700 Insomnia, unspecified: Secondary | ICD-10-CM

## 2019-04-15 DIAGNOSIS — Z130 Encounter for screening for diseases of the blood and blood-forming organs and certain disorders involving the immune mechanism: Secondary | ICD-10-CM

## 2019-04-15 DIAGNOSIS — E119 Type 2 diabetes mellitus without complications: Secondary | ICD-10-CM

## 2019-04-15 DIAGNOSIS — Z125 Encounter for screening for malignant neoplasm of prostate: Secondary | ICD-10-CM

## 2019-04-15 LAB — POCT GLYCOSYLATED HEMOGLOBIN (HGB A1C): Hemoglobin A1C: 6.2 % — AB (ref 4.0–5.6)

## 2019-04-15 NOTE — Progress Notes (Addendum)
Patient ID: Dustin Cox, male    DOB: [birth year], 49 y.o.   MRN: [id number]  PCP: Loletta Specter, PA-C  Chief Complaint  Patient presents with  . Diabetes  . Hyperlipidemia    Subjective:  HPI Dustin Cox is a 49 y.o. male presents for evaluation of diabetes and cholesterol.  Medical history significant for tobacco use, type 2 diabetes, alcohol dependence, mix hyperlipidemia and hyperglyceridemia.   Dustin Cox is here for diabetes and cholesterol  follow-up. He doesn't  monitor glucose at home. Currently has been without Metformin for 2 years and endorses minimal changes to diet to improve blood sugar control. he occasional walks for physical activity, although denies any routine physical activity regimen. He endorses some changes in vision and sensation of FB in eye. He has had this sensation for awhile, however, has not followed up at urgent care and or ophthalmology. Denies any neuropathy symptoms. Unfortunately continues to smoke on average 1 pack of cigarettes per day. Continue drink an average of 2 beers per day. Endorses fatigue and poor night sleep.  Denies chest pain, dizziness, and  shortness of breath. Denies any illicit drug use.   Social History   Socioeconomic History  . Marital status: Married    Spouse name: Not on file  . Number of children: Not on file  . Years of education: Not on file  . Highest education level: Not on file  Occupational History  . Not on file  Tobacco Use  . Smoking status: Light Tobacco Smoker    Packs/day: 0.25    Years: 30.00    Pack years: 7.50    Types: Cigarettes  . Smokeless tobacco: Never Used  Substance and Sexual Activity  . Alcohol use: Yes    Alcohol/week: 0.0 standard drinks    Comment: drinks about 7-9 beers every other weekend  . Drug use: Yes    Types: Cocaine    Comment: last used 1 year ago  . Sexual activity: Yes    Partners: Female  Other Topics Concern  . Not on  file  Social History Narrative   Lives at home with wife and 3 children.  Currently unemployed.  Used to work in [employer].   Social Determinants of Health   Financial Resource Strain:   . Difficulty of Paying Living Expenses:   Food Insecurity:   . Worried About Programme researcher, broadcasting/film/video in the Last Year:   . Barista in the Last Year:   Transportation Needs:   . Freight forwarder (Medical):   Marland Kitchen Lack of Transportation (Non-Medical):   Physical Activity:   . Days of Exercise per Week:   . Minutes of Exercise per Session:   Stress:   . Feeling of Stress :   Social Connections:   . Frequency of Communication with Friends and Family:   . Frequency of Social Gatherings with Friends and Family:   . Attends Religious Services:   . Active Member of Clubs or Organizations:   . Attends Banker Meetings:   Marland Kitchen Marital Status:   Intimate Partner Violence:   . Fear of Current or Ex-Partner:   . Emotionally Abused:   Marland Kitchen Physically Abused:   . Sexually Abused:     Family History  Problem Relation Age of Onset  . Hyperlipidemia Mother   . Early death Father    Review of Systems Pertinent negatives listed in HPI Patient Active Problem List   Diagnosis Date Noted  .  Alcoholism in recovery (HCC) 03/03/2018  . Type 2 diabetes mellitus without complication, without long-term current use of insulin (HCC) 01/03/2018  . Alcohol abuse 01/03/2018  . 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 Chronic throat pain 05/25/2016  . Chronic upper back pain 04/02/2014  . Elevated blood sugar 04/02/2014  . GERD (gastroesophageal reflux disease) 04/02/2014  . Cough 04/02/2014  . Light smoker 04/02/2014  . Rhomboid muscle strain 09/06/2013  . Allergic rhinitis 09/19/2012  . Hypertriglyceridemia 02/23/2012    No Known Allergies  Prior to Admission medications   Medication Sig Start Date End Date Taking? Authorizing Provider  folic acid (FOLVITE) 1 MG tablet Take 1 tablet (1 mg  total) by mouth daily. Patient not taking: Reported on 04/15/2019 01/03/18   Loletta Specter, PA-C  gemfibrozil (LOPID) 600 MG tablet Take 1 tablet (600 mg total) by mouth 2 (two) times daily before a meal. Patient not taking: Reported on 04/15/2019 01/03/18   Loletta Specter, PA-C  metFORMIN (GLUCOPHAGE) 500 MG tablet Take 1 tablet (500 mg total) by mouth 2 (two) times daily with a meal. Patient not taking: Reported on 04/15/2019 01/03/18   Loletta Specter, PA-C  naltrexone (DEPADE) 50 MG tablet Take 1 tablet (50 mg total) by mouth daily. Patient not taking: Reported on 04/15/2019 01/03/18   Loletta Specter, PA-C    Past Medical, Surgical Family and Social History reviewed and updated.    Objective:   Today's Vitals   04/15/19 0859 04/15/19 0911  BP: (!) 147/104 (!) 139/92  Pulse: 74 82  Temp: (!) 97.3 F (36.3 C)   TempSrc: Temporal   SpO2: 99%   Weight: 192 lb 6.4 oz (87.3 kg)   Height: 5\' 9"  (1.753 m)   PainSc: 0-No pain     Wt Readings from Last 3 Encounters:  04/15/19 192 lb 6.4 oz (87.3 kg)  03/01/18 185 lb 9.6 oz (84.2 kg)  01/03/18 183 lb (83 kg)     Physical Exam Physical Exam: Constitutional: Patient appears well-developed and well-nourished. No distress. HENT: Normocephalic, atraumatic, External right and left ear normal.  Eyes: Conjunctivae and EOM are normal. PERRLA. Scleral erythema present  (right eye), left eye sclera normal. Neck: Normal ROM. Neck supple. No JVD. No tracheal deviation. No thyromegaly. CVS: RRR, S1/S2 +, no murmurs, no gallops, no carotid bruit.  Pulmonary: Effort and breath sounds normal, no stridor, rhonchi, wheezes, rales.  Abdominal: Soft. BS +, no distension, tenderness, rebound or guarding.  Musculoskeletal: Normal range of motion. No edema and no tenderness.  Neuro: Alert. Normal reflexes, muscle tone coordination. Gait intact Skin: Skin is warm and dry. No rash noted. Not diaphoretic. No erythema. No pallor. Psychiatric:  Normal mood and affect. Behavior, judgment, thought content normal.  Lab Results  Component Value Date   POCGLU 177 (A) 04/02/2014    Lab Results  Component Value Date   HGBA1C 6.2 (A) 04/15/2019    Assessment & Plan:  1. Type 2 diabetes mellitus without complication, without long-term current use of insulin (HCC) - HgB A1c, 6.2 stable Discussed with patient following all okay to continue with lifestyle changes I will hold off on resuming Metformin.  However if cholesterol numbers remain grossly elevated advised that I would recommend resuming the Metformin as best indication that his diet has not improved to a level of managing his cholesterol and I would not want his diabetes to develop into an uncontrolled level also given that he is still currently consuming alcohol on a routine  basis.  Patient was in agreement to wait and to see what his cholesterol numbers looked like before resuming Metformin. -Encourage smoking cessation - Microalbumin/Creatinine Ratio, Urine - Comprehensive metabolic panel  2. Hypertriglyceridemia, fasting - Lipid panel - Thyroid Panel With TSH  3. Elevated lipoprotein(a) - Lipid panel - Microalbumin/Creatinine Ratio, Urine  4. Screening for deficiency anemia -CBC pending   5. Insomnia, unspecified type, likely secondary to alcohol consumption and inactivity -Increase physical activity and avoid alcohol consumption during the hours close to bedtime  6. Screening for prostate cancer - PSA     -The patient was given clear instructions to go to ER or return to medical center if symptoms do not improve, worsen or new problems develop. The patient verbalized understanding.    Molli Barrows, FNP (Covering Provider_ [facility]  [address]  [place], [place] [phone]

## 2019-04-15 NOTE — Patient Instructions (Signed)
I encourage you to work of reducing smoking and eventually quitting smoking. This will improve your overall health and reduce the risk for developing heart disease.  I am checking your cholesterol today and other screening labs. You will be notified of your results and any recommendations for treatment via phone. I will hold off on prescribing any medications until I have your labs results.  I recommend eye exam and if you think you have a foreign body in your eye, please follow-up immediately at urgent care   [facility] [address] [place] [address]  [facility] Located in: [facility] Address: [address] [place], [place], [place] [address] Hours: 8 AM-5PM Phone: [phone]     Diabetes mellitus y nutricin, en adultos Diabetes Mellitus and Nutrition, Adult Si sufre de diabetes (diabetes mellitus), es muy importante tener hbitos alimenticios saludables debido a que sus niveles de Psychologist, counselling sangre (glucosa) se ven afectados en gran medida por lo que come y bebe. [place] alimentos saludables en las cantidades [place], aproximadamente a la [employer], [place] ayudar a:  Scientist, physiological glucemia.  Disminuir el riesgo de sufrir una enfermedad cardaca.  Mejorar la presin arterial.  Barista o mantener un peso saludable. Todas las personas que sufren de diabetes son diferentes y cada una tiene necesidades diferentes en cuanto a un plan de alimentacin. El mdico puede recomendarle que trabaje con un especialista en dietas y nutricin (nutricionista) para Tax adviser plan para usted. Su plan de alimentacin puede variar segn factores como:  Las caloras que necesita.  Los medicamentos que toma.  Su peso.  Sus niveles de glucemia, presin arterial y colesterol.  Su nivel de [place].  Otras afecciones que tenga, como enfermedades cardacas o renales. Cmo me afectan los carbohidratos? Los carbohidratos, o hidratos de carbono, afectan su  nivel de glucemia ms que cualquier otro tipo de alimento. La ingesta de carbohidratos naturalmente aumenta la cantidad de [employer]. El recuento de carbohidratos es un mtodo destinado a Midwife un registro de la cantidad de carbohidratos que se consumen. El recuento de carbohidratos es importante para Pharmacologist la glucemia a un nivel saludable, especialmente si utiliza insulina o toma determinados medicamentos por va oral para la diabetes. Es importante conocer la cantidad de carbohidratos que se pueden ingerir en cada comida sin correr Surveyor, minerals. Esto es Government social research officer. Su nutricionista puede ayudarlo a calcular la cantidad de carbohidratos que debe ingerir en cada comida y en cada refrigerio. Entre los alimentos que contienen carbohidratos, se incluyen:  Pan, cereal, arroz, pastas y galletas.  Papas y maz.  Guisantes, frijoles y lentejas.  Leche y Dentist.  Nils Pyle y [place].  Postres, como pasteles, galletas, helado y caramelos. Cmo me afecta el alcohol? El alcohol puede provocar disminuciones sbitas de la glucemia (hipoglucemia), especialmente si utiliza insulina o toma determinados medicamentos por va oral para la diabetes. La hipoglucemia es una afeccin potencialmente mortal. Los sntomas de la hipoglucemia (somnolencia, mareos y confusin) son similares a los sntomas de haber consumido demasiado alcohol. Si el mdico afirma que el alcohol es seguro para usted, [place] estas pautas:  Limite el consumo de alcohol a no ms de por da si es mujer y no est Kevin, y a si es hombre. Una medida equivale a 12oz ( ) de cerveza, 5oz ( ) de vino o 1oz (56ml) de bebidas alcohlicas de alta graduacin.  No beba con el estmago vaco.  Mantngase hidratado bebiendo  agua, refrescos dietticos o t helado sin azcar.  Tenga en cuenta que los refrescos comunes, los jugos y otras bebida para Engineer, manufacturing pueden contener mucha azcar y se deben  contar como carbohidratos. Cules son algunos consejos para seguir este plan?  Leer las etiquetas de los alimentos  Comience por leer el tamao de la porcin en la "Informacin nutricional" en las etiquetas de los alimentos envasados y las bebidas. La cantidad de caloras, carbohidratos, grasas y otros nutrientes mencionados en la etiqueta se basan en una porcin del alimento. Muchos alimentos contienen ms de una porcin por envase.  Verifique la cantidad total de gramos (g) de carbohidratos totales en una porcin. Puede calcular la cantidad de porciones de carbohidratos al dividir el total de carbohidratos por 15. Por ejemplo, si un alimento tiene un total de 30g de carbohidratos, equivale a 2 porciones de carbohidratos.  Verifique la cantidad de gramos (g) de grasas saturadas y grasas trans en una porcin. Escoja alimentos que no contengan grasa o que tengan un bajo contenido.  Verifique la cantidad de miligramos (mg) de sal (sodio) en una porcin. La mayora de las personas deben limitar la ingesta de sodio total a menos de 2300mg  por .  Siempre consulte la informacin nutricional de los alimentos etiquetados como "con bajo contenido de grasa" o "sin grasa". Estos alimentos pueden tener un mayor contenido de Futures trader agregada o carbohidratos refinados, y deben evitarse.  Hable con su nutricionista para identificar sus objetivos diarios en cuanto a los nutrientes mencionados en la etiqueta. Al ir de compras  Evite comprar alimentos procesados, enlatados o precocinados. Estos alimentos tienden a International aid/development worker mayor cantidad de [place], sodio y azcar agregada.  Compre en la zona exterior de la tienda de comestibles. Esta zona incluye frutas y verduras frescas, granos a granel, carnes frescas y productos lcteos frescos. Al cocinar  Utilice mtodos de coccin a baja temperatura, como hornear, en lugar de mtodos de coccin a alta temperatura, como frer en abundante aceite.  Cocine con  aceites saludables, como el aceite de [place], canola o [place].  Evite cocinar con manteca, crema o carnes con alto contenido de grasa. Planificacin de las comidas  Coma las comidas y los refrigerios regularmente, preferentemente a la misma hora todos [place]. Evite pasar largos perodos de tiempo sin comer.  Consuma alimentos ricos en fibra, como frutas frescas, verduras, frijoles y cereales integrales. Consulte a su nutricionista sobre cuntas porciones de carbohidratos puede consumir en cada comida.  Consuma entre 4 y 6 onzas (oz) de protenas magras por da, como carnes [place], pollo, pescado, huevos o tofu. Una onza de protena magra equivale a: ? 1 onza de carne, pollo o pescado. ? 1huevo. ?  taza de tofu.  Coma algunos alimentos por da que contengan grasas saludables, como aguacates, frutos secos, semillas y pescado. Estilo de vida  Controle su nivel de glucemia con regularidad.  Haga actividad fsica habitualmente como se lo haya indicado el mdico. Esto puede incluir lo siguiente: ? [place] semanales de ejercicio de intensidad moderada o alta. Esto podra incluir caminatas dinmicas, ciclismo o gimnasia acutica. ? Realizar ejercicios de elongacin y de fortalecimiento, como yoga o levantamiento de pesas, por lo menos 2veces por semana.  Tome los se lo haya indicado el mdico.  No consuma ningn producto que contenga nicotina o tabaco, como cigarrillos y [employer]. Si necesita ayuda para dejar de fumar, consulte al mdico.  Administrator, Civil Service con un asesor o instructor en diabetes para identificar estrategias para  controlar el estrs y cualquier desafo emocional y social. Preguntas para hacerle al mdico  Es necesario que consulte a Radio broadcast assistant en el cuidado de la diabetes?  Es necesario que me rena con un nutricionista?  A qu nmero puedo llamar si tengo preguntas?  Cules son los mejores momentos para controlar la glucemia? Dnde  encontrar ms informacin:  Asociacin Estadounidense de la Diabetes (American Diabetes Association): diabetes.org  Academia de Nutricin y Information systems manager (Academy of Nutrition and Dietetics): www.eatright.Amagon Diabetes y las Enfermedades Digestivas y [facility] of Diabetes and Digestive and Kidney Diseases, NIH): [place]inesFuneral.dk Resumen  Un plan de alimentacin saludable lo ayudar a Aeronautical engineer glucemia y Theatre manager un estilo de vida saludable.  Trabajar con un especialista en dietas y nutricin (nutricionista) puede ayudarlo a Insurance claims handler de alimentacin para usted.  Tenga en cuenta que los carbohidratos (hidratos de carbono) y el alcohol tienen efectos inmediatos en sus niveles de glucemia. Es importante contar los carbohidratos que ingiere y consumir alcohol con prudencia. Esta informacin no tiene Marine scientist el consejo del mdico. Asegrese de hacerle al mdico cualquier pregunta que tenga. Document Revised: 09/20/2016 Document Reviewed: 05/02/2016 Elsevier Patient Education  [place].

## 2019-04-16 LAB — COMPREHENSIVE METABOLIC PANEL WITH GFR
ALT: 17 IU/L (ref 0–44)
AST: 20 IU/L (ref 0–40)
Albumin/Globulin Ratio: 2.1 (ref 1.2–2.2)
Albumin: 4.4 g/dL (ref 4.0–5.0)
Alkaline Phosphatase: 95 IU/L (ref 39–117)
BUN/Creatinine Ratio: 16 (ref 9–20)
BUN: 15 mg/dL (ref 6–24)
Bilirubin Total: 0.7 mg/dL (ref 0.0–1.2)
CO2: 21 mmol/L (ref 20–29)
Calcium: 9.1 mg/dL (ref 8.7–10.2)
Chloride: 103 mmol/L (ref 96–106)
Creatinine, Ser: 0.92 mg/dL (ref 0.76–1.27)
GFR calc Af Amer: 114 mL/min/1.73 (ref >59)
GFR calc non Af Amer: 99 mL/min/1.73 (ref >59)
Globulin, Total: 2.1 g/dL (ref 1.5–4.5)
Glucose: 114 mg/dL — ABNORMAL HIGH (ref 65–99)
Potassium: 4.1 mmol/L (ref 3.5–5.2)
Sodium: 140 mmol/L (ref 134–144)
Total Protein: 6.5 g/dL (ref 6.0–8.5)

## 2019-04-16 LAB — CBC WITH DIFFERENTIAL/PLATELET
Basophils Absolute: 0 10*3/uL (ref 0.0–0.2)
Basos: 0 %
EOS (ABSOLUTE): 0.2 10*3/uL (ref 0.0–0.4)
Eos: 4 %
Hematocrit: 46.3 % (ref 37.5–51.0)
Hemoglobin: 16.2 g/dL (ref 13.0–17.7)
Immature Grans (Abs): 0 10*3/uL (ref 0.0–0.1)
Immature Granulocytes: 0 %
Lymphocytes Absolute: 2.3 10*3/uL (ref 0.7–3.1)
Lymphs: 45 %
MCH: 31 pg (ref 26.6–33.0)
MCHC: 35 g/dL (ref 31.5–35.7)
MCV: 89 fL (ref 79–97)
Monocytes Absolute: 0.5 10*3/uL (ref 0.1–0.9)
Monocytes: 10 %
Neutrophils Absolute: 2.2 10*3/uL (ref 1.4–7.0)
Neutrophils: 41 %
Platelets: 190 10*3/uL (ref 150–450)
RBC: 5.23 x10E6/uL (ref 4.14–5.80)
RDW: 13.2 % (ref 11.6–15.4)
WBC: 5.2 10*3/uL (ref 3.4–10.8)

## 2019-04-16 LAB — LIPID PANEL
Chol/HDL Ratio: 3.2 ratio (ref 0.0–5.0)
Cholesterol, Total: 178 mg/dL (ref 100–199)
HDL: 55 mg/dL (ref >39)
LDL Chol Calc (NIH): 103 mg/dL — ABNORMAL HIGH (ref 0–99)
Triglycerides: 111 mg/dL (ref 0–149)
VLDL Cholesterol Cal: 20 mg/dL (ref 5–40)

## 2019-04-16 LAB — THYROID PANEL WITH TSH
Free Thyroxine Index: 1.6 (ref 1.2–4.9)
T3 Uptake Ratio: 27 % (ref 24–39)
T4, Total: 6 ug/dL (ref 4.5–12.0)
TSH: 5.45 u[IU]/mL — ABNORMAL HIGH (ref 0.450–4.500)

## 2019-04-16 LAB — MICROALBUMIN / CREATININE URINE RATIO
Creatinine, Urine: 124.1 mg/dL
Microalb/Creat Ratio: <2 mg/g{creat} (ref 0–29)
Microalbumin, Urine: <3 ug/mL

## 2019-04-16 LAB — PSA: Prostate Specific Ag, Serum: 0.6 ng/mL (ref 0.0–4.0)

## 2019-04-19 ENCOUNTER — Telehealth (INDEPENDENT_AMBULATORY_CARE_PROVIDER_SITE_OTHER): Payer: Self-pay

## 2019-04-19 NOTE — Telephone Encounter (Signed)
-----   Message from Bing Neighbors, FNP sent at 04/16/2019  5:46 PM EDT ----- Contact patient to advise thyroid test was abnormal and will need to be repeated in 4 weeks. Please schedule a lab visit. All other labs have improved. No additional action warranted. He can hold off on resuming metformin as long as he continues to follow-up in 6 months to recheck cholesterol and A1c.

## 2019-04-19 NOTE — Telephone Encounter (Signed)
Call placed using pacific interpreter [phone]) patient verified date of birth. He is aware that his thyroid test was abnormal and needs to be rechecked in 4 weeks. Scheduled lab appointment for April 23 at 9 am. He is aware that all other labs are normal. Patient instructed to stop metformin as long as he continues to follow up in 6 months to recheck cholesterol and A1c. He verbalized understanding. Maryjean Morn, CMA

## 2019-05-07 ENCOUNTER — Ambulatory Visit: Payer: Self-pay

## 2019-05-07 ENCOUNTER — Other Ambulatory Visit: Payer: Self-pay

## 2019-05-17 ENCOUNTER — Other Ambulatory Visit (INDEPENDENT_AMBULATORY_CARE_PROVIDER_SITE_OTHER): Payer: Self-pay

## 2019-05-17 ENCOUNTER — Other Ambulatory Visit: Payer: Self-pay

## 2019-05-17 ENCOUNTER — Other Ambulatory Visit (INDEPENDENT_AMBULATORY_CARE_PROVIDER_SITE_OTHER): Payer: Self-pay | Admitting: Primary Care

## 2019-05-17 DIAGNOSIS — E781 Pure hyperglyceridemia: Secondary | ICD-10-CM

## 2019-05-18 LAB — THYROID PANEL WITH TSH
Free Thyroxine Index: 1.8 (ref 1.2–4.9)
T3 Uptake Ratio: 28 % (ref 24–39)
T4, Total: 6.3 ug/dL (ref 4.5–12.0)
TSH: 4.08 u[IU]/mL (ref 0.450–4.500)

## 2019-05-23 ENCOUNTER — Telehealth (INDEPENDENT_AMBULATORY_CARE_PROVIDER_SITE_OTHER): Payer: Self-pay

## 2019-05-23 NOTE — Telephone Encounter (Signed)
Call placed to patient using pacific interpreter [phone]) patient did not answer. Left message informing patient that his thyroid is normal. Return call to [facility] at [phone] with any questions or concerns. Maryjean Morn, CMA

## 2019-05-23 NOTE — Telephone Encounter (Signed)
-----   Message from Grayce Sessions, NP sent at 05/18/2019 11:45 PM EDT ----- Thyroid function is normal

## 2021-07-08 ENCOUNTER — Other Ambulatory Visit: Payer: Self-pay

## 2021-07-08 ENCOUNTER — Encounter (INDEPENDENT_AMBULATORY_CARE_PROVIDER_SITE_OTHER): Payer: Self-pay | Admitting: Primary Care

## 2021-07-08 ENCOUNTER — Ambulatory Visit (INDEPENDENT_AMBULATORY_CARE_PROVIDER_SITE_OTHER): Payer: Self-pay | Admitting: Primary Care

## 2021-07-08 ENCOUNTER — Ambulatory Visit (INDEPENDENT_AMBULATORY_CARE_PROVIDER_SITE_OTHER): Payer: Self-pay

## 2021-07-08 VITALS — BP 119/83 | HR 71 | Temp 98.1°F | Ht 69.0 in | Wt 207.4 lb

## 2021-07-08 DIAGNOSIS — R5383 Other fatigue: Secondary | ICD-10-CM

## 2021-07-08 DIAGNOSIS — F32A Depression, unspecified: Secondary | ICD-10-CM

## 2021-07-08 DIAGNOSIS — E119 Type 2 diabetes mellitus without complications: Secondary | ICD-10-CM

## 2021-07-08 DIAGNOSIS — F320 Major depressive disorder, single episode, mild: Secondary | ICD-10-CM

## 2021-07-08 DIAGNOSIS — R630 Anorexia: Secondary | ICD-10-CM

## 2021-07-08 LAB — POCT GLYCOSYLATED HEMOGLOBIN (HGB A1C): Hemoglobin A1C: 6.9 % — AB (ref 4.0–5.6)

## 2021-07-08 MED ORDER — METFORMIN HCL ER 500 MG PO TB24
500.0000 mg | ORAL_TABLET | Freq: Every day | ORAL | 1 refills | Status: AC
  Filled 2021-07-08: qty 90, 90d supply, fill #0
  Filled 2021-09-30: qty 90, 90d supply, fill #1

## 2021-07-08 NOTE — Telephone Encounter (Signed)
Interpreter Lorelle Gibbs [phone]  Chief Complaint: fatigue Symptoms: fatigue, tiredness, affecting daily activities Frequency: going on for awhile Pertinent Negatives: NA Disposition: [] ED /[] Urgent Care (no appt availability in office) / [x] Appointment(In office/virtual)/ []  [facility]/ [] Home Care/ [] Refused Recommended Disposition /[] [facility]/ []  Follow-up with PCP Additional Notes: pt unsure if this is r/t his diabetes or cholesterol but has changed his lifestyle habits and just feels fatigued all the time. Scheduled appt for today at 1410 with PCP.   Summary: feeling well, is very fatigued   Pt stated he is not feeling well, is very fatigued, and tired just wants to be in bed.   Pt stated is scared that he may be diebetic, as he was pre-diabetic last he came in for an appointment.   Scheduled pt an appointment for 07/22/2021   Pt seeking clinical advice.   Pt needs spanish interpreter.      Reason for Disposition  [1] MILD weakness (i.e., does not interfere with ability to work, go to school, normal activities) AND [2] persists > 1 week  Answer Assessment - Initial Assessment Questions 1. DESCRIPTION: "Describe how you are feeling."     Very tired all the time and fatigued  2. SEVERITY: "How bad is it?"  "Can you stand and walk?"   - MILD - Feels weak or tired, but does not interfere with work, school or normal activities   - MODERATE - Able to stand and walk; weakness interferes with work, school, or normal activities   - SEVERE - Unable to stand or walk     Mild to moderate  3. ONSET:  "When did the weakness begin?"     Going on for a while  4. CAUSE: "What do you think is causing the weakness?"     Unsure if diabetic or cholesterol  5. MEDICINES: "Have you recently started a new medicine or had a change in the amount of a medicine?"     no 6. OTHER SYMPTOMS: "Do you have any other symptoms?" (e.g., chest pain, fever, cough, SOB, vomiting, diarrhea,  bleeding, other areas of pain)     no  Protocols used: Weakness (Generalized) and Fatigue-A-AH

## 2021-07-08 NOTE — Patient Instructions (Signed)
Diagnstico de la diabetes mellitus tipo 2 en los adultos Type 2 Diabetes Mellitus, Diagnosis, Adult La diabetes tipo 2 (diabetes mellitus tipo 2) es una enfermedad a largo plazo (crnica). Puede ocurrir cuando existe uno de [place], o ambos: El pncreas no produce la cantidad suficiente de insulina. El cuerpo no reacciona de forma normal a la insulina que produce. La insulina permite que los azcares ingresen en las clulas del cuerpo. Si tiene diabetes tipo 2, los azcares no pueden ingresar en las clulas. Los azcares se Games developer. Esto causa un nivel elevado de azcar en la sangre. Cules son las causas? Se desconoce la causa exacta de esta afeccin. Qu incrementa el riesgo? Tener diabetes tipo 2 en su familia. Tener sobrepeso u obesidad. Ser sedentario. El cuerpo no reacciona de forma normal a la insulina que produce. Tener un nivel de azcar en la sangre ms alto de lo normal con el transcurso del [place]. Haber sufrido un tipo de diabetes durante un embarazo. Tener una afeccin que causa la formacin de pequeos sacos llenos de lquido en los ovarios. Cules son los signos o sntomas? Al principio, es posible que usted no presente sntomas. Sus sntomas empezarn lentamente. Estos pueden incluir los siguientes: Ms sed de lo normal. Ms Denese Killings de lo normal. Necesidad de orinar con mayor frecuencia que lo normal. Perder peso sin proponrselo. Cansancio. Sensacin de debilidad. Ver las cosas borrosas. Manchas oscuras en la piel. Cmo se trata? Esta afeccin puede ser tratada por un experto en diabetes. Puede que deba hacer lo siguiente: Seguir un plan de alimentacin elaborado por un experto en alimentacin (nutricionista). Hacer ejercicio con regularidad. Encontrar formas de lidiar con Development worker, community. Comprobar su nivel de azcar en la sangre con la frecuencia que le hayan indicado. Tomar medicamentos. El mdico fijar los objetivos del tratamiento para  usted. Su nivel de azcar en la sangre debe estar en estos niveles: Antes de las comidas: de 80 a 130 mg/dl (de 4.4 a 7.2 mmol/l). Despus de las comidas: por debajo de 180 mg/dl (10 mmol/l). En los ltimos 2 a 3 meses: menos del 7 %. Siga estas instrucciones en su casa: [employer] para la diabetes o aplquese la [employer] [place]. Tome los medicamentos como le hayan indicado para prevenir otros problemas causados por esta afeccin. Puede necesitar: Aspirina. Medicamentos para Advertising account executive. Medicamentos para Scientist, physiological presin arterial. Preguntas para hacerle al mdico Es necesario que me rena con un educador en el cuidado de la diabetes? Qu medicamentos necesito y cundo debo tomarlos? Qu necesitar para tratar mi afeccin en casa? Cundo debo comprobar mi azcar en la sangre? Dnde puedo encontrar un grupo de apoyo? A quin puedo llamar si tengo preguntas? Cundo es mi prxima visita con el mdico? Instrucciones generales Use los medicamentos de venta libre y los recetados solamente como se lo haya indicado el mdico. Oceanographer a todas las visitas de seguimiento. Dnde buscar ms informacin Para obtener ayuda y orientacin, as como ms informacin sobre la diabetes, visite: American Diabetes Association (ADA) (Asociacin Estadounidense de la Diabetes): www.diabetes.org American Association of Diabetes Care and Education Specialists (ADCES) (Asociacin Estadounidense de Especialistas en Atencin y Educacin sobre la Diabetes): www.diabeteseducator.org International Diabetes Federation (IDF) (Federacin Internacional de Diabetes): DCOnly.dk Comunquese con un mdico si: El nivel de azcar en la sangre es igual o mayor que 240 mg/dl (80.9 mmol/dl) durante 2 das seguidos. Se ha sentido enfermo durante 2 o ms das y no mejora. Ha tenido  fiebre durante 2 o ms das y no mejora. Tiene alguno de estos problemas durante ms de 6 horas: No  puede comer ni beber. Siente que va a vomitar. Vomita. Presenta heces lquidas (diarrea). Solicite ayuda de inmediato si: El nivel de azcar en la sangre est por debajo de 54 mg/dl (3 mmol/l). Se siente desorientado (confundido). Tiene problemas para pensar con claridad. Tiene dificultad para respirar. Tiene niveles medios o altos de cetonas en la [place]. Estos sntomas pueden Customer service manager. Solicite ayuda de inmediato. Comunquese con el servicio de emergencias de su localidad (911 en los [place]). No espere a ver si los sntomas desaparecen. No conduzca por sus propios medios [employer]. Resumen La diabetes tipo 2 es una enfermedad de larga duracin. Es posible que el pncreas no produzca suficiente insulina o que el cuerpo no reaccione de forma normal a la insulina que produce. Esta afeccin se trata con un plan de alimentacin, cambios en el estilo de vida y medicamentos. El mdico fijar los objetivos del tratamiento para usted. Estos lo ayudarn a Sports coach en un nivel saludable. Concurra a todas las visitas de seguimiento. Esta informacin no tiene Theme park manager el consejo del mdico. Asegrese de hacerle al mdico cualquier pregunta que tenga. Document Revised: 04/30/2020 Document Reviewed: 04/30/2020 Elsevier Patient Education  2023 [employer].

## 2021-07-08 NOTE — Progress Notes (Unsigned)
Dustin Cox, is a 51 y.o. male  HAL:[id number]  BDZ:[id number]  DOB - Feb 12, 1970  Chief Complaint  Patient presents with   Fatigue       Subjective:   Dustin Cox is a 51 y.o. Hispanic male (interpreter Elyse Hsu [phone]) here today for fatigue doesn't feel like not doing anything. Asked if felt depressed "yes" denies harm to self or others, hallucinations or auditory.  Patient has No headache, No chest pain, No abdominal pain - No Nausea, No new weakness tingling or numbness, No Cough - shortness of breath  No problems updated.  No Known Allergies  Past Medical History:  Diagnosis Date   Hyperlipidemia 2014    No current outpatient medications on file prior to visit.   No current facility-administered medications on file prior to visit.    Objective:   Vitals:   07/08/21 1400  BP: 119/83  Pulse: 71  Temp: 98.1 F (36.7 C)  TempSrc: Oral  SpO2: 95%  Weight: 207 lb 6.4 oz (94.1 kg)  Height: $Remove'5\' 9"'[id number]$  (1.753 m)    Exam General appearance : Awake, alert, not in any distress. Speech Clear. Not toxic looking HEENT: Atraumatic and Normocephalic, pupils equally reactive to light and accomodation Neck: Supple, no JVD. No cervical lymphadenopathy.  Chest: Good air entry bilaterally, no added sounds  CVS: S1 S2 regular, no murmurs.  Abdomen: Bowel sounds present, Non tender and not distended with no gaurding, rigidity or rebound. Extremities: B/L Lower Ext shows no edema, both legs are warm to touch Neurology: Awake alert, and oriented X 3, Non focal Skin: No Rash  Data Review Lab Results  Component Value Date   HGBA1C 6.9 (A) 07/08/2021   HGBA1C 6.2 (A) 04/15/2019   HGBA1C 6.0 (A) 01/03/2018    Assessment & Plan  Dustin Cox was seen today for fatigue.  Diagnoses and all orders for this visit:  Type 2 diabetes mellitus without complication, without long-term current use of insulin (HCC) -     HgB A1c  6.9 Monitor foods that are high in carbohydrates are the following rice, potatoes, breads, sugars, and pastas.  Reduction in the intake (eating) will assist in lowering your blood sugars.  -     CMP14+EGFR -     Lipid Panel -     CBC with Differential -     metFORMIN (GLUCOPHAGE-XR) 500 MG 24 hr tablet; Take 1 tablet (500 mg total) by mouth daily with breakfast.  Fatigue due to depression -     TSH + free T4 -     Vitamin D, 25-hydroxy  Current mild episode of major depressive disorder, unspecified whether recurrent ([place]) Discuss types of therapy and medication declines   Loss of appetite -     TSH + free T4  Patient have been counseled extensively about nutrition and exercise. Other issues discussed during this visit include: low cholesterol diet, weight control and daily exercise, foot care, annual eye examinations at Ophthalmology, importance of adherence with medications and regular follow-up. We also discussed long term complications of uncontrolled diabetes and hypertension.   Return in about 3 months (around 10/08/2021) for DM .  The patient was given clear instructions to go to ER or return to medical center if symptoms don't improve, worsen or new problems develop. The patient verbalized understanding. The patient was told to call to get lab results if they haven't heard anything in the next week.   This note has been created with Dragon speech  [place]. Any transcriptional errors are unintentional.   Kerin Perna, NP 07/10/2021, 5:26 PM

## 2021-07-09 LAB — CBC WITH DIFFERENTIAL/PLATELET
Basophils Absolute: 0 10*3/uL (ref 0.0–0.2)
Basos: 1 %
EOS (ABSOLUTE): 0.2 10*3/uL (ref 0.0–0.4)
Eos: 3 %
Hematocrit: 51.9 % — ABNORMAL HIGH (ref 37.5–51.0)
Hemoglobin: 17.6 g/dL (ref 13.0–17.7)
Immature Grans (Abs): 0 10*3/uL (ref 0.0–0.1)
Immature Granulocytes: 0 %
Lymphocytes Absolute: 2.5 10*3/uL (ref 0.7–3.1)
Lymphs: 43 %
MCH: 30 pg (ref 26.6–33.0)
MCHC: 33.9 g/dL (ref 31.5–35.7)
MCV: 88 fL (ref 79–97)
Monocytes Absolute: 0.5 10*3/uL (ref 0.1–0.9)
Monocytes: 8 %
Neutrophils Absolute: 2.6 10*3/uL (ref 1.4–7.0)
Neutrophils: 45 %
Platelets: 168 10*3/uL (ref 150–450)
RBC: 5.87 x10E6/uL — ABNORMAL HIGH (ref 4.14–5.80)
RDW: 13.7 % (ref 11.6–15.4)
WBC: 5.8 10*3/uL (ref 3.4–10.8)

## 2021-07-09 LAB — CMP14+EGFR
ALT: 35 IU/L (ref 0–44)
AST: 27 IU/L (ref 0–40)
Albumin/Globulin Ratio: 2.2 (ref 1.2–2.2)
Albumin: 4.9 g/dL (ref 4.0–5.0)
Alkaline Phosphatase: 73 IU/L (ref 44–121)
BUN/Creatinine Ratio: 13 (ref 9–20)
BUN: 13 mg/dL (ref 6–24)
Bilirubin Total: 1.4 mg/dL — ABNORMAL HIGH (ref 0.0–1.2)
CO2: 21 mmol/L (ref 20–29)
Calcium: 9.2 mg/dL (ref 8.7–10.2)
Chloride: 103 mmol/L (ref 96–106)
Creatinine, Ser: 0.98 mg/dL (ref 0.76–1.27)
Globulin, Total: 2.2 g/dL (ref 1.5–4.5)
Glucose: 121 mg/dL — ABNORMAL HIGH (ref 70–99)
Potassium: 4.3 mmol/L (ref 3.5–5.2)
Sodium: 141 mmol/L (ref 134–144)
Total Protein: 7.1 g/dL (ref 6.0–8.5)
eGFR: 95 mL/min/{1.73_m2} (ref >59)

## 2021-07-09 LAB — VITAMIN D 25 HYDROXY (VIT D DEFICIENCY, FRACTURES): Vit D, 25-Hydroxy: 24 ng/mL — ABNORMAL LOW (ref 30.0–100.0)

## 2021-07-09 LAB — TSH+FREE T4
Free T4: 1.13 ng/dL (ref 0.82–1.77)
TSH: 3.61 u[IU]/mL (ref 0.450–4.500)

## 2021-07-09 LAB — LIPID PANEL
Chol/HDL Ratio: 5.5 ratio — ABNORMAL HIGH (ref 0.0–5.0)
Cholesterol, Total: 224 mg/dL — ABNORMAL HIGH (ref 100–199)
HDL: 41 mg/dL (ref >39)
LDL Chol Calc (NIH): 153 mg/dL — ABNORMAL HIGH (ref 0–99)
Triglycerides: 167 mg/dL — ABNORMAL HIGH (ref 0–149)
VLDL Cholesterol Cal: 30 mg/dL (ref 5–40)

## 2021-07-21 ENCOUNTER — Other Ambulatory Visit (INDEPENDENT_AMBULATORY_CARE_PROVIDER_SITE_OTHER): Payer: Self-pay | Admitting: Primary Care

## 2021-07-21 ENCOUNTER — Other Ambulatory Visit: Payer: Self-pay

## 2021-07-21 DIAGNOSIS — E782 Mixed hyperlipidemia: Secondary | ICD-10-CM

## 2021-07-21 MED ORDER — PRAVASTATIN SODIUM 40 MG PO TABS
40.0000 mg | ORAL_TABLET | Freq: Every day | ORAL | 3 refills | Status: DC
  Filled 2021-07-21 – 2022-03-04 (×2): qty 90, 90d supply, fill #0
  Filled 2022-06-04: qty 90, 90d supply, fill #1

## 2021-07-22 ENCOUNTER — Ambulatory Visit (INDEPENDENT_AMBULATORY_CARE_PROVIDER_SITE_OTHER): Payer: Self-pay | Admitting: Primary Care

## 2021-07-23 ENCOUNTER — Telehealth (INDEPENDENT_AMBULATORY_CARE_PROVIDER_SITE_OTHER): Payer: Self-pay

## 2021-07-23 NOTE — Telephone Encounter (Signed)
-----   Message from Grayce Sessions, NP sent at 07/21/2021  1:50 PM EDT ----- Labs are  normal  except cholesterol. Your cholesterol is high, Increase risk of heart attack and/or stroke.  To reduce your Cholesterol , Remember - more fruits and vegetables, more fish, and limit red meat and dairy products. More soy, nuts, beans, barley, lentils, oats and plant sterol ester enriched margarine instead of butter. I also encourage eliminating sugar and processed food. New script sent for pravastatin 40mg   at bedtime

## 2021-07-23 NOTE — Telephone Encounter (Signed)
Called patient with pacific interpreter [phone]) patient aware of results, medication and diet advise. He verbalized understanding. Maryjean Morn, CMA

## 2021-07-28 ENCOUNTER — Other Ambulatory Visit: Payer: Self-pay

## 2021-09-30 ENCOUNTER — Other Ambulatory Visit: Payer: Self-pay

## 2021-10-01 ENCOUNTER — Other Ambulatory Visit: Payer: Self-pay

## 2021-10-12 ENCOUNTER — Ambulatory Visit (INDEPENDENT_AMBULATORY_CARE_PROVIDER_SITE_OTHER): Payer: Self-pay | Admitting: Primary Care

## 2022-02-14 ENCOUNTER — Telehealth: Payer: Self-pay | Admitting: Primary Care

## 2022-02-14 ENCOUNTER — Ambulatory Visit (INDEPENDENT_AMBULATORY_CARE_PROVIDER_SITE_OTHER): Payer: Self-pay | Admitting: Primary Care

## 2022-02-14 NOTE — Telephone Encounter (Signed)
Copied from [place] [id number]. Topic: General - Inquiry >> Feb 14, 2022  5:23 PM Leilani Able wrote: Reason for CRM: PT HAS AN APPT TODAY  1/23 TUESDAY, AND REALLY WANTED TO GET LAB WORK DONE. HE WANTS A CALL BACK AS EARLY AS POSSIBLE. HE CALLED TO MAKE SURE OF APPT TIME AND WAS VERY DISAPPOINTED IT WAS IN AFTERNOON. THERE WAS NOT ANYTHING EARLY AS SOON AS HE WOULD LIKE. WANT A FU CALL TO SEE IF COULD HAVE LABS EARLY, STATES HE WILL PU PHONE AS SOON AS IT [place], [phone]

## 2022-02-15 ENCOUNTER — Ambulatory Visit (INDEPENDENT_AMBULATORY_CARE_PROVIDER_SITE_OTHER): Payer: Self-pay | Admitting: Primary Care

## 2022-02-15 NOTE — Telephone Encounter (Signed)
Returned pt call. Offered pt an appt for 1/25 at 11:10am. Pt states he will take that appt. Appt has been reschedule for a sooner appt. Pt doesn't have any questions or concerns

## 2022-02-17 ENCOUNTER — Other Ambulatory Visit: Payer: Self-pay

## 2022-02-17 ENCOUNTER — Encounter (INDEPENDENT_AMBULATORY_CARE_PROVIDER_SITE_OTHER): Payer: Self-pay | Admitting: Primary Care

## 2022-02-17 ENCOUNTER — Other Ambulatory Visit (INDEPENDENT_AMBULATORY_CARE_PROVIDER_SITE_OTHER): Payer: Self-pay

## 2022-02-17 ENCOUNTER — Ambulatory Visit (INDEPENDENT_AMBULATORY_CARE_PROVIDER_SITE_OTHER): Payer: Self-pay | Admitting: Primary Care

## 2022-02-17 VITALS — BP 111/78 | HR 69 | Resp 16 | Ht 71.0 in | Wt 208.8 lb

## 2022-02-17 DIAGNOSIS — Z1211 Encounter for screening for malignant neoplasm of colon: Secondary | ICD-10-CM

## 2022-02-17 DIAGNOSIS — E663 Overweight: Secondary | ICD-10-CM

## 2022-02-17 DIAGNOSIS — Z2821 Immunization not carried out because of patient refusal: Secondary | ICD-10-CM

## 2022-02-17 DIAGNOSIS — Z28310 Unvaccinated for covid-19: Secondary | ICD-10-CM

## 2022-02-17 DIAGNOSIS — F320 Major depressive disorder, single episode, mild: Secondary | ICD-10-CM

## 2022-02-17 DIAGNOSIS — E119 Type 2 diabetes mellitus without complications: Secondary | ICD-10-CM

## 2022-02-17 DIAGNOSIS — Z1159 Encounter for screening for other viral diseases: Secondary | ICD-10-CM

## 2022-02-17 DIAGNOSIS — E782 Mixed hyperlipidemia: Secondary | ICD-10-CM

## 2022-02-17 LAB — POCT GLYCOSYLATED HEMOGLOBIN (HGB A1C): HbA1c, POC (controlled diabetic range): 6.5 % (ref 0.0–7.0)

## 2022-02-17 NOTE — Progress Notes (Signed)
[place], is a 52 y.o. male  AJO:[id number]  NOB:[id number]  DOB - [birth year]  Chief Complaint  Patient presents with   Diabetes       Subjective:   Dustin Cox is a 52 y.o. 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 He denies polyuria, polydipsia, polyphagia or vision changes.   Patient has No headache, No chest pain, No abdominal pain - No Nausea, No new weakness tingling or numbness, No Cough - shortness of breath. He is depression receiving mail from the city about his house doesn't understand -no [place]. He previously was on "ice and ethol " been in a counseling group that has change his life.   No problems updated.  No Known Allergies  Past Medical History:  Diagnosis Date   Hyperlipidemia 2014    Current Outpatient Medications on File Prior to Visit  Medication Sig Dispense Refill   metFORMIN (GLUCOPHAGE-XR) 500 MG 24 hr tablet Take 1 tablet (500 mg total) by mouth daily with breakfast. 90 tablet 1   pravastatin (PRAVACHOL) 40 MG tablet Take 1 tablet (40 mg total) by mouth daily. 90 tablet 3   No current facility-administered medications on file prior to visit.    Objective:   Vitals:   02/17/22 0845  BP: 111/78  Pulse: 69  Resp: 16  SpO2: 95%  Weight: 208 lb 12.8 oz (94.7 kg)  Height: 5\' 11"  (1.803 m)    Exam General appearance : Awake, alert, not in any distress. Speech Clear. Not toxic looking HEENT: Atraumatic and Normocephalic, pupils equally reactive to light and accomodation Neck: Supple, no JVD. No cervical lymphadenopathy.  Chest: Good air entry bilaterally, no added sounds  CVS: S1 S2 regular, no murmurs.  Abdomen: Bowel sounds present, Non tender and not distended with no gaurding, rigidity or rebound. Extremities: B/L Lower Ext shows no edema, both legs are warm to touch Neurology: Awake alert, and oriented X 3, CN II-XII intact, Non  focal Skin: No Rash  Data Review Lab Results  Component Value Date   HGBA1C 6.5 02/17/2022   HGBA1C 6.9 (A) 07/08/2021   HGBA1C 6.2 (A) 04/15/2019    Assessment & Plan  Dustin Cox was seen today for diabetes.  Diagnoses and all orders for this visit:  Type 2 diabetes mellitus without complication, without long-term current use of insulin (HCC) -     POCT glycosylated hemoglobin (Hb A1C) 6.5 improved from 6.9 - educated on lifestyle modifications, including but not limited to diet choices and adding exercise to daily routine.    -     Microalbumin / creatinine urine ratio -     CBC with Differential/Platelet -     CMP14+EGFR  Mixed hyperlipidemia -     Lipid panel  Current mild episode of major depressive disorder, unspecified whether recurrent ([place])   Row Labels 02/17/2022    9:38 AM 07/08/2021    2:44 PM 04/15/2019    9:11 AM 01/03/2018    8:52 AM 07/03/2017   10:08 AM  Depression screen PHQ 2/9   Section Header. No data exists in this row.       Decreased Interest   2 3 0 2 1  Down, Depressed, Hopeless   3 3 0 2 1  PHQ - 2 Score   5 6 0 4 2  Altered sleeping   2 2  0 1  Tired, decreased energy   3 3  1  1  Change in appetite   1 0  2 0  Feeling bad or failure about yourself    1 3  0 0  Trouble concentrating   0 1  0 1  Moving slowly or fidgety/restless   0 1  0 0  Suicidal thoughts   0 0  1 0  PHQ-9 Score   12 16  8 5   Difficult doing work/chores    Somewhat difficult     Referred to CSW   Overweight (BMI 25.0-29.9) Discussed diet and exercise for person with BMI >25. Instructed: You must burn more calories than you eat. Losing 5 percent of your body weight should be considered a success. In the longer term, losing more than 15 percent of your body weight and staying at this weight is an extremely good result. However, keep in mind that even losing 5 percent of your body weight leads to important health benefits, so try not to get discouraged if you're not able to  lose more than this. Will recheck weight in 3-6 months.   Comprehensive diabetic foot examination, type 2 DM, encounter for [facility]) completed  COVID-19 vaccine series declined  Need for hepatitis C screening test -     HCV Ab w Reflex to Quant PCR  Colon cancer screening -     Fecal occult blood, imunochemical; Future  Other orders -     Specimen status report -     HCV RT-PCR, Quant (Non-Graph)     Patient have been counseled extensively about nutrition and exercise. Other issues discussed during this visit include: low cholesterol diet, weight control and daily exercise, foot care, annual eye examinations at Ophthalmology, importance of adherence with medications and regular follow-up. We also discussed long term complications of uncontrolled diabetes and hypertension.   Return in about 6 months (around 08/18/2022) for DM.  The patient was given clear instructions to go to ER or return to medical center if symptoms don't improve, worsen or new problems develop. The patient verbalized understanding. The patient was told to call to get lab results if they haven't heard anything in the next week.   This note has been created with Surveyor, quantity. Any transcriptional errors are unintentional.   Kerin Perna, NP 02/22/2022, 10:38 PM

## 2022-02-18 LAB — MICROALBUMIN / CREATININE URINE RATIO

## 2022-02-18 LAB — SPECIMEN STATUS REPORT

## 2022-02-19 LAB — HCV RT-PCR, QUANT (NON-GRAPH)

## 2022-02-19 LAB — SPECIMEN STATUS REPORT

## 2022-02-19 LAB — FECAL OCCULT BLOOD, IMMUNOCHEMICAL: Fecal Occult Bld: NEGATIVE

## 2022-02-22 ENCOUNTER — Encounter (INDEPENDENT_AMBULATORY_CARE_PROVIDER_SITE_OTHER): Payer: Self-pay | Admitting: Primary Care

## 2022-02-22 LAB — MICROALBUMIN / CREATININE URINE RATIO
Creatinine, Urine: 150.9 mg/dL
Microalb/Creat Ratio: <2 mg/g creat (ref 0–29)
Microalbumin, Urine: <3 ug/mL

## 2022-02-22 LAB — SPECIMEN STATUS REPORT

## 2022-02-23 LAB — CMP14+EGFR
ALT: 29 IU/L (ref 0–44)
AST: 27 IU/L (ref 0–40)
Albumin/Globulin Ratio: 2.1 (ref 1.2–2.2)
Albumin: 4.7 g/dL (ref 4.1–5.1)
Alkaline Phosphatase: 70 IU/L (ref 44–121)
BUN/Creatinine Ratio: 14 (ref 9–20)
BUN: 14 mg/dL (ref 6–24)
Bilirubin Total: 1.1 mg/dL (ref 0.0–1.2)
CO2: 21 mmol/L (ref 20–29)
Calcium: 8.9 mg/dL (ref 8.7–10.2)
Chloride: 106 mmol/L (ref 96–106)
Creatinine, Ser: 1.02 mg/dL (ref 0.76–1.27)
Globulin, Total: 2.2 g/dL (ref 1.5–4.5)
Glucose: 122 mg/dL — ABNORMAL HIGH (ref 70–99)
Potassium: 4.3 mmol/L (ref 3.5–5.2)
Sodium: 144 mmol/L (ref 134–144)
Total Protein: 6.9 g/dL (ref 6.0–8.5)
eGFR: 90 mL/min/{1.73_m2} (ref >59)

## 2022-02-23 LAB — CBC WITH DIFFERENTIAL/PLATELET
Basophils Absolute: 0 10*3/uL (ref 0.0–0.2)
Basos: 1 %
EOS (ABSOLUTE): 0.2 10*3/uL (ref 0.0–0.4)
Eos: 3 %
Hematocrit: 50.4 % (ref 37.5–51.0)
Hemoglobin: 17.4 g/dL (ref 13.0–17.7)
Immature Grans (Abs): 0 10*3/uL (ref 0.0–0.1)
Immature Granulocytes: 0 %
Lymphocytes Absolute: 2.6 10*3/uL (ref 0.7–3.1)
Lymphs: 41 %
MCH: 30.5 pg (ref 26.6–33.0)
MCHC: 34.5 g/dL (ref 31.5–35.7)
MCV: 88 fL (ref 79–97)
Monocytes Absolute: 0.6 10*3/uL (ref 0.1–0.9)
Monocytes: 9 %
Neutrophils Absolute: 2.9 10*3/uL (ref 1.4–7.0)
Neutrophils: 46 %
Platelets: 156 10*3/uL (ref 150–450)
RBC: 5.71 x10E6/uL (ref 4.14–5.80)
RDW: 13 % (ref 11.6–15.4)
WBC: 6.4 10*3/uL (ref 3.4–10.8)

## 2022-02-23 LAB — HCV RT-PCR, QUANT (NON-GRAPH): Hepatitis C Quantitation: NOT DETECTED IU/mL

## 2022-02-23 LAB — LIPID PANEL
Chol/HDL Ratio: 3.5 ratio (ref 0.0–5.0)
Cholesterol, Total: 132 mg/dL (ref 100–199)
HDL: 38 mg/dL — ABNORMAL LOW (ref >39)
LDL Chol Calc (NIH): 67 mg/dL (ref 0–99)
Triglycerides: 160 mg/dL — ABNORMAL HIGH (ref 0–149)
VLDL Cholesterol Cal: 27 mg/dL (ref 5–40)

## 2022-02-23 LAB — HCV AB W REFLEX TO QUANT PCR: HCV Ab: UNDETERMINED — AB

## 2022-02-23 LAB — MICROALBUMIN / CREATININE URINE RATIO

## 2022-02-24 ENCOUNTER — Ambulatory Visit (INDEPENDENT_AMBULATORY_CARE_PROVIDER_SITE_OTHER): Payer: Self-pay | Admitting: Primary Care

## 2022-02-25 ENCOUNTER — Telehealth (INDEPENDENT_AMBULATORY_CARE_PROVIDER_SITE_OTHER): Payer: Self-pay

## 2022-02-25 NOTE — Telephone Encounter (Signed)
Contacted pt to go over lab results pt is aware and doesn't have any questions or concerns 

## 2022-03-03 ENCOUNTER — Telehealth (INDEPENDENT_AMBULATORY_CARE_PROVIDER_SITE_OTHER): Payer: Self-pay

## 2022-03-03 NOTE — Telephone Encounter (Signed)
[place] interpreters [place]  Id#  [id number] contacted pt to go over lab results pt is aware and doesn't have any questions or concerns

## 2022-03-04 ENCOUNTER — Other Ambulatory Visit: Payer: Self-pay

## 2022-05-19 ENCOUNTER — Encounter (INDEPENDENT_AMBULATORY_CARE_PROVIDER_SITE_OTHER): Payer: Self-pay

## 2022-05-19 ENCOUNTER — Encounter (INDEPENDENT_AMBULATORY_CARE_PROVIDER_SITE_OTHER): Payer: Self-pay | Admitting: Primary Care

## 2022-06-01 NOTE — Progress Notes (Signed)
left

## 2022-06-06 ENCOUNTER — Other Ambulatory Visit: Payer: Self-pay

## 2022-06-07 ENCOUNTER — Other Ambulatory Visit: Payer: Self-pay

## 2022-08-24 ENCOUNTER — Ambulatory Visit (INDEPENDENT_AMBULATORY_CARE_PROVIDER_SITE_OTHER): Payer: Self-pay | Admitting: Primary Care

## 2022-08-25 ENCOUNTER — Ambulatory Visit (INDEPENDENT_AMBULATORY_CARE_PROVIDER_SITE_OTHER): Payer: Self-pay | Admitting: Primary Care

## 2022-09-01 ENCOUNTER — Other Ambulatory Visit (INDEPENDENT_AMBULATORY_CARE_PROVIDER_SITE_OTHER): Payer: Self-pay | Admitting: Primary Care

## 2022-09-01 DIAGNOSIS — E782 Mixed hyperlipidemia: Secondary | ICD-10-CM

## 2022-09-01 NOTE — Telephone Encounter (Signed)
Medication Refill - Medication: Rx #: [id number]  pravastatin (PRAVACHOL) 40 MG tablet [[id number]]   Has the patient contacted their pharmacy? Yes.   (Agent: If no, request that the patient contact the pharmacy for the refill. If patient does not wish to contact the pharmacy document the reason why and proceed with request.) (Agent: If yes, when and what did the pharmacy advise?)  Preferred Pharmacy (with phone number or street name): [facility] Phone: [phone]  Fax: [phone]   Has the patient been seen for an appointment in the last year OR does the patient have an upcoming appointment? Yes.    Agent: Please be advised that RX refills may take up to 3 business days. We ask that you follow-up with your pharmacy.

## 2022-09-02 ENCOUNTER — Other Ambulatory Visit: Payer: Self-pay

## 2022-09-02 MED ORDER — PRAVASTATIN SODIUM 40 MG PO TABS
40.0000 mg | ORAL_TABLET | Freq: Every day | ORAL | 1 refills | Status: AC
Start: 2022-09-02 — End: ?
  Filled 2022-09-02: qty 90, 90d supply, fill #0
  Filled 2022-11-23: qty 90, 90d supply, fill #1

## 2022-09-02 NOTE — Telephone Encounter (Signed)
Requested Prescriptions  Pending Prescriptions Disp Refills   pravastatin (PRAVACHOL) 40 MG tablet 90 tablet 1    Sig: Take 1 tablet (40 mg total) by mouth daily.     Cardiovascular:  Antilipid - Statins Failed - 09/02/2022  8:02 AM      Failed - Lipid Panel in normal range within the last 12 months    Cholesterol, Total  Date Value Ref Range Status  02/17/2022 132 100 - 199 mg/dL Final   LDL Chol Calc (NIH)  Date Value Ref Range Status  02/17/2022 67 0 - 99 mg/dL Final   HDL  Date Value Ref Range Status  02/17/2022 38 (L) >39 mg/dL Final   Triglycerides  Date Value Ref Range Status  02/17/2022 160 (H) 0 - 149 mg/dL Final         Passed - Patient is not pregnant      Passed - Valid encounter within last 12 months    Recent Outpatient Visits           6 months ago Type 2 diabetes mellitus without complication, without long-term current use of insulin (HCC)   [place] [facility] Grayce Sessions, NP   1 year ago Type 2 diabetes mellitus without complication, without long-term current use of insulin (HCC)   [place] [facility] Grayce Sessions, NP   3 years ago Type 2 diabetes mellitus without complication, without long-term current use of insulin (HCC)   [place] [facility] Bing Neighbors, NP   4 years ago Type 2 diabetes mellitus without complication, without long-term current use of insulin [facility])   [place] [facility] [place], Kinnie Scales, NP   4 years ago Cough headache   [place] [facility] [place], Kinnie Scales, NP

## 2022-11-23 ENCOUNTER — Other Ambulatory Visit: Payer: Self-pay

## 2022-11-29 ENCOUNTER — Other Ambulatory Visit: Payer: Self-pay

## 2023-03-07 ENCOUNTER — Other Ambulatory Visit (INDEPENDENT_AMBULATORY_CARE_PROVIDER_SITE_OTHER): Payer: Self-pay | Admitting: Primary Care

## 2023-03-07 DIAGNOSIS — E782 Mixed hyperlipidemia: Secondary | ICD-10-CM

## 2023-03-07 NOTE — Telephone Encounter (Signed)
Requested medication (s) are due for refill today: Yes  Requested medication (s) are on the active medication list: Yes  Last refill:  11/29/22  Future visit scheduled: No  Notes to clinic:  Unable to refill per protocol, appointment needed.      Requested Prescriptions  Pending Prescriptions Disp Refills   pravastatin (PRAVACHOL) 40 MG tablet 90 tablet 1    Sig: Take 1 tablet (40 mg total) by mouth daily.     Cardiovascular:  Antilipid - Statins Failed - 03/07/2023  4:33 PM      Failed - Valid encounter within last 12 months    Recent Outpatient Visits           1 year ago Type 2 diabetes mellitus without complication, without long-term current use of insulin (HCC)   [place] [facility] Grayce Sessions, NP   1 year ago Type 2 diabetes mellitus without complication, without long-term current use of insulin (HCC)   [place] [facility] Grayce Sessions, NP   3 years ago Type 2 diabetes mellitus without complication, without long-term current use of insulin (HCC)   [place] [facility] Bing Neighbors, FNP   4 years ago Type 2 diabetes mellitus without complication, without long-term current use of insulin (HCC)    [facility] [place] Grayce Sessions, NP   5 years ago Cough headache    [facility] [place] Gwinda Passe P, NP              Failed - Lipid Panel in normal range within the last 12 months    Cholesterol, Total  Date Value Ref Range Status  02/17/2022 132 100 - 199 mg/dL Final   LDL Chol Calc (NIH)  Date Value Ref Range Status  02/17/2022 67 0 - 99 mg/dL Final   HDL  Date Value Ref Range Status  02/17/2022 38 (L) >39 mg/dL Final   Triglycerides  Date Value Ref Range Status  02/17/2022 160 (H) 0 - 149 mg/dL Final         Passed - Patient is not pregnant

## 2023-03-15 ENCOUNTER — Other Ambulatory Visit: Payer: Self-pay

## 2024-02-01 ENCOUNTER — Ambulatory Visit (INDEPENDENT_AMBULATORY_CARE_PROVIDER_SITE_OTHER): Payer: Self-pay | Admitting: Primary Care
